# Patient Record
Sex: Male | Born: 1945
Health system: Southern US, Community
[De-identification: ages and names within clinical notes are randomized; demographics above are authoritative.]

## PROBLEM LIST (undated history)

## (undated) DIAGNOSIS — J449 Chronic obstructive pulmonary disease, unspecified: Secondary | ICD-10-CM

## (undated) DIAGNOSIS — R55 Syncope and collapse: Secondary | ICD-10-CM

## (undated) DIAGNOSIS — R0600 Dyspnea, unspecified: Secondary | ICD-10-CM

## (undated) DIAGNOSIS — I219 Acute myocardial infarction, unspecified: Secondary | ICD-10-CM

## (undated) DIAGNOSIS — I471 Supraventricular tachycardia, unspecified: Secondary | ICD-10-CM

## (undated) DIAGNOSIS — E785 Hyperlipidemia, unspecified: Secondary | ICD-10-CM

## (undated) DIAGNOSIS — I2510 Atherosclerotic heart disease of native coronary artery without angina pectoris: Secondary | ICD-10-CM

## (undated) DIAGNOSIS — Z8669 Personal history of other diseases of the nervous system and sense organs: Secondary | ICD-10-CM

## (undated) DIAGNOSIS — I10 Essential (primary) hypertension: Secondary | ICD-10-CM

## (undated) DIAGNOSIS — I480 Paroxysmal atrial fibrillation: Secondary | ICD-10-CM

## (undated) DIAGNOSIS — K219 Gastro-esophageal reflux disease without esophagitis: Secondary | ICD-10-CM

## (undated) HISTORY — DX: Syncope and collapse: R55

## (undated) HISTORY — DX: Atherosclerotic heart disease of native coronary artery without angina pectoris: I25.10

## (undated) HISTORY — DX: Essential (primary) hypertension: I10

## (undated) HISTORY — DX: Chronic obstructive pulmonary disease, unspecified: J44.9

## (undated) HISTORY — DX: Supraventricular tachycardia: I47.1

## (undated) HISTORY — DX: Hyperlipidemia, unspecified: E78.5

## (undated) HISTORY — DX: Personal history of other diseases of the nervous system and sense organs: Z86.69

## (undated) HISTORY — DX: Supraventricular tachycardia, unspecified: I47.10

## (undated) HISTORY — DX: Paroxysmal atrial fibrillation: I48.0

## (undated) HISTORY — DX: Gastro-esophageal reflux disease without esophagitis: K21.9

## (undated) HISTORY — DX: Dyspnea, unspecified: R06.00

## (undated) HISTORY — DX: Acute myocardial infarction, unspecified: I21.9

---

## 1969-11-23 HISTORY — PX: KNEE SURGERY: SHX244

## 1999-02-03 ENCOUNTER — Emergency Department (HOSPITAL_COMMUNITY): Admission: EM | Admit: 1999-02-03 | Discharge: 1999-02-03 | Payer: Self-pay | Admitting: Emergency Medicine

## 1999-02-12 ENCOUNTER — Ambulatory Visit (HOSPITAL_BASED_OUTPATIENT_CLINIC_OR_DEPARTMENT_OTHER): Admission: RE | Admit: 1999-02-12 | Discharge: 1999-02-12 | Payer: Self-pay | Admitting: Orthopedic Surgery

## 2001-03-18 ENCOUNTER — Encounter: Payer: Self-pay | Admitting: Specialist

## 2001-03-18 ENCOUNTER — Ambulatory Visit (HOSPITAL_COMMUNITY): Admission: RE | Admit: 2001-03-18 | Discharge: 2001-03-18 | Payer: Self-pay | Admitting: Specialist

## 2001-04-29 ENCOUNTER — Ambulatory Visit (HOSPITAL_COMMUNITY): Admission: RE | Admit: 2001-04-29 | Discharge: 2001-04-29 | Payer: Self-pay | Admitting: Orthopedic Surgery

## 2001-04-29 ENCOUNTER — Encounter: Payer: Self-pay | Admitting: Orthopedic Surgery

## 2007-03-31 ENCOUNTER — Emergency Department (HOSPITAL_COMMUNITY): Admission: EM | Admit: 2007-03-31 | Discharge: 2007-03-31 | Payer: Self-pay | Admitting: Family Medicine

## 2007-04-12 ENCOUNTER — Ambulatory Visit: Payer: Self-pay | Admitting: Internal Medicine

## 2007-04-13 ENCOUNTER — Ambulatory Visit (HOSPITAL_COMMUNITY): Admission: RE | Admit: 2007-04-13 | Discharge: 2007-04-13 | Payer: Self-pay | Admitting: Internal Medicine

## 2007-04-14 ENCOUNTER — Ambulatory Visit: Payer: Self-pay | Admitting: *Deleted

## 2007-05-06 ENCOUNTER — Ambulatory Visit (HOSPITAL_COMMUNITY): Admission: RE | Admit: 2007-05-06 | Discharge: 2007-05-06 | Payer: Self-pay | Admitting: Internal Medicine

## 2007-05-12 ENCOUNTER — Ambulatory Visit: Payer: Self-pay | Admitting: Internal Medicine

## 2007-06-09 ENCOUNTER — Ambulatory Visit: Payer: Self-pay | Admitting: Internal Medicine

## 2007-06-30 DIAGNOSIS — J309 Allergic rhinitis, unspecified: Secondary | ICD-10-CM | POA: Insufficient documentation

## 2007-06-30 DIAGNOSIS — I10 Essential (primary) hypertension: Secondary | ICD-10-CM | POA: Insufficient documentation

## 2007-06-30 DIAGNOSIS — Z87898 Personal history of other specified conditions: Secondary | ICD-10-CM | POA: Insufficient documentation

## 2007-06-30 DIAGNOSIS — Z8719 Personal history of other diseases of the digestive system: Secondary | ICD-10-CM | POA: Insufficient documentation

## 2007-06-30 DIAGNOSIS — R0602 Shortness of breath: Secondary | ICD-10-CM | POA: Insufficient documentation

## 2007-08-03 ENCOUNTER — Telehealth (INDEPENDENT_AMBULATORY_CARE_PROVIDER_SITE_OTHER): Payer: Self-pay | Admitting: Internal Medicine

## 2007-11-27 ENCOUNTER — Emergency Department (HOSPITAL_COMMUNITY): Admission: EM | Admit: 2007-11-27 | Discharge: 2007-11-27 | Payer: Self-pay | Admitting: *Deleted

## 2008-02-05 ENCOUNTER — Telehealth (INDEPENDENT_AMBULATORY_CARE_PROVIDER_SITE_OTHER): Payer: Self-pay | Admitting: Internal Medicine

## 2009-05-21 ENCOUNTER — Ambulatory Visit: Payer: Self-pay | Admitting: Cardiology

## 2009-05-21 ENCOUNTER — Inpatient Hospital Stay (HOSPITAL_COMMUNITY): Admission: EM | Admit: 2009-05-21 | Discharge: 2009-05-23 | Payer: Self-pay | Admitting: Emergency Medicine

## 2009-05-22 ENCOUNTER — Encounter: Payer: Self-pay | Admitting: Cardiology

## 2009-05-22 HISTORY — PX: CARDIAC ELECTROPHYSIOLOGY STUDY AND ABLATION: SHX1294

## 2009-05-23 ENCOUNTER — Encounter: Payer: Self-pay | Admitting: Cardiology

## 2009-07-25 DIAGNOSIS — I498 Other specified cardiac arrhythmias: Secondary | ICD-10-CM | POA: Insufficient documentation

## 2009-07-25 DIAGNOSIS — R55 Syncope and collapse: Secondary | ICD-10-CM | POA: Insufficient documentation

## 2009-07-25 DIAGNOSIS — I4891 Unspecified atrial fibrillation: Secondary | ICD-10-CM | POA: Insufficient documentation

## 2009-08-05 ENCOUNTER — Encounter (INDEPENDENT_AMBULATORY_CARE_PROVIDER_SITE_OTHER): Payer: Self-pay | Admitting: *Deleted

## 2009-10-07 ENCOUNTER — Emergency Department (HOSPITAL_COMMUNITY): Admission: EM | Admit: 2009-10-07 | Discharge: 2009-10-07 | Payer: Self-pay | Admitting: Emergency Medicine

## 2010-11-05 ENCOUNTER — Emergency Department (HOSPITAL_COMMUNITY)
Admission: EM | Admit: 2010-11-05 | Discharge: 2010-11-05 | Payer: Self-pay | Source: Home / Self Care | Admitting: Emergency Medicine

## 2011-03-02 LAB — BASIC METABOLIC PANEL
BUN: 15 mg/dL (ref 6–23)
CO2: 29 mEq/L (ref 19–32)
Calcium: 8.7 mg/dL (ref 8.4–10.5)
Chloride: 108 mEq/L (ref 96–112)
Creatinine, Ser: 1.24 mg/dL (ref 0.4–1.5)
Creatinine, Ser: 1.3 mg/dL (ref 0.4–1.5)
GFR calc Af Amer: >60 mL/min (ref >60)
GFR calc Af Amer: >60 mL/min (ref >60)
GFR calc non Af Amer: 56 mL/min — ABNORMAL LOW (ref >60)
GFR calc non Af Amer: 59 mL/min — ABNORMAL LOW (ref >60)
Potassium: 3.8 mEq/L (ref 3.5–5.1)

## 2011-03-02 LAB — DIFFERENTIAL
Lymphocytes Relative: 23 % (ref 12–46)
Lymphs Abs: 1.8 10*3/uL (ref 0.7–4.0)
Monocytes Relative: 8 % (ref 3–12)
Neutrophils Relative %: 66 % (ref 43–77)

## 2011-03-02 LAB — CBC
HCT: 45.9 % (ref 39.0–52.0)
MCV: 89.7 fL (ref 78.0–100.0)
Platelets: 191 10*3/uL (ref 150–400)
RBC: 5.12 MIL/uL (ref 4.22–5.81)
WBC: 8.2 10*3/uL (ref 4.0–10.5)

## 2011-03-02 LAB — POCT CARDIAC MARKERS
CKMB, poc: 1.1 ng/mL (ref 1.0–8.0)
CKMB, poc: 1.1 ng/mL (ref 1.0–8.0)
Myoglobin, poc: 180 ng/mL (ref 12–200)

## 2011-03-02 LAB — TROPONIN I: Troponin I: 0.03 ng/mL (ref 0.00–0.06)

## 2011-03-02 LAB — URINALYSIS, ROUTINE W REFLEX MICROSCOPIC
Bilirubin Urine: NEGATIVE
Hgb urine dipstick: NEGATIVE
Ketones, ur: NEGATIVE mg/dL
Nitrite: NEGATIVE
Protein, ur: NEGATIVE mg/dL
Urobilinogen, UA: 1 mg/dL (ref 0.0–1.0)

## 2011-03-02 LAB — RAPID URINE DRUG SCREEN, HOSP PERFORMED
Amphetamines: NOT DETECTED
Opiates: NOT DETECTED
Tetrahydrocannabinol: NOT DETECTED

## 2011-03-02 LAB — PROTIME-INR: Prothrombin Time: 12.8 seconds (ref 11.6–15.2)

## 2011-03-02 LAB — D-DIMER, QUANTITATIVE: D-Dimer, Quant: 0.42 ug/mL-FEU (ref 0.00–0.48)

## 2011-03-02 LAB — CARDIAC PANEL(CRET KIN+CKTOT+MB+TROPI)
Relative Index: INVALID (ref 0.0–2.5)
Total CK: 115 U/L (ref 7–232)

## 2011-03-02 LAB — LIPID PANEL
Cholesterol: 158 mg/dL (ref 0–200)
HDL: 25 mg/dL — ABNORMAL LOW (ref >39)
LDL Cholesterol: 117 mg/dL — ABNORMAL HIGH (ref 0–99)
Total CHOL/HDL Ratio: 6.3 RATIO
VLDL: 16 mg/dL (ref 0–40)

## 2011-03-02 LAB — BRAIN NATRIURETIC PEPTIDE: Pro B Natriuretic peptide (BNP): 99 pg/mL (ref 0.0–100.0)

## 2011-03-02 LAB — CK TOTAL AND CKMB (NOT AT ARMC)
CK, MB: 2.7 ng/mL (ref 0.3–4.0)
Relative Index: 2.1 (ref 0.0–2.5)

## 2011-03-02 LAB — MAGNESIUM: Magnesium: 2.2 mg/dL (ref 1.5–2.5)

## 2011-03-02 LAB — APTT: aPTT: 25 seconds (ref 24–37)
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
## 2011-04-10 NOTE — Op Note (Signed)
[facility]  Patient:    [name], [name]                     MRN: [id number] Proc. Date: 04/29/01 Adm. Date:  [id number] Attending:  Skip Mayer                           Operative Report  PREOPERATIVE DIAGNOSES: 1. Bucket handle tear of the posterior aspect of the medial meniscus, left    knee. 2. 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  POSTOPERATIVE DIAGNOSES: 1. Bucket handle tear of the posterior aspect of the medial meniscus, left    knee. 2. 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  OPERATION: 1. Diagnostic arthroscopy, left knee. 2. Synovectomy, left knee. 3. Partial medial meniscectomy, left knee.  SURGEON:  Georges Lynch. Darrelyn Hillock, M.D.  ASSISTANT:  Nurse.  DESCRIPTION OF PROCEDURE:  Under general anesthesia, routine orthopedic prep and drape of left lower extremity carried out.  Small punctate incision made in the suprapatellar pouch.  Inflow cannula was inserted.  Knee was distended with saline.  Another small punctate incision was made in the anterior lateral joint space.  I entered the arthroscope and did a complete diagnostic arthroscopy.  The only pertinent positive findings was 1) he had degenerative arthritis of the medial compartment of the left knee; and 2) he had a large bucket handle tear which was very irregular at the posterior of the medial meniscus, left knee.  In introduced the shaver suction device at the medial approach and did a synovectomy and then did a medial meniscectomy.  I thoroughly irrigated out the knee, closed all three punctate incisions with 3-0 nylon suture.  I injected 20 cc of 0.5% Marcaine with epinephrine into the knee joint.  Sterile Neosporin bundle dressing was applied.  FOLLOWUP CARE: 1. He will be on crutches, partial weightbearing. 2. Bufferin 1 twice a day as anticoagulant for a few weeks. 3. Mepergan Fortis for pain. 4. I will  see him in the office in 12 to 14 days or prior for evidence    of problem. DD:  04/29/01 TD:  04/30/01 Job: [id number]

## 2011-04-25 ENCOUNTER — Inpatient Hospital Stay (HOSPITAL_COMMUNITY)
Admission: EM | Admit: 2011-04-25 | Discharge: 2011-04-29 | DRG: 246 | Disposition: A | Discharge status: Home / Self Care | Payer: Medicare Other | Attending: Internal Medicine | Admitting: Internal Medicine

## 2011-04-25 ENCOUNTER — Emergency Department (HOSPITAL_COMMUNITY): Payer: Medicare Other

## 2011-04-25 DIAGNOSIS — I200 Unstable angina: Secondary | ICD-10-CM

## 2011-04-25 DIAGNOSIS — Z91199 Patient's noncompliance with other medical treatment and regimen due to unspecified reason: Secondary | ICD-10-CM

## 2011-04-25 DIAGNOSIS — I219 Acute myocardial infarction, unspecified: Secondary | ICD-10-CM

## 2011-04-25 DIAGNOSIS — E669 Obesity, unspecified: Secondary | ICD-10-CM | POA: Diagnosis present

## 2011-04-25 DIAGNOSIS — I2510 Atherosclerotic heart disease of native coronary artery without angina pectoris: Secondary | ICD-10-CM

## 2011-04-25 DIAGNOSIS — K219 Gastro-esophageal reflux disease without esophagitis: Secondary | ICD-10-CM | POA: Diagnosis present

## 2011-04-25 DIAGNOSIS — I129 Hypertensive chronic kidney disease with stage 1 through stage 4 chronic kidney disease, or unspecified chronic kidney disease: Secondary | ICD-10-CM | POA: Diagnosis present

## 2011-04-25 DIAGNOSIS — E785 Hyperlipidemia, unspecified: Secondary | ICD-10-CM | POA: Diagnosis present

## 2011-04-25 DIAGNOSIS — Z7982 Long term (current) use of aspirin: Secondary | ICD-10-CM

## 2011-04-25 DIAGNOSIS — I469 Cardiac arrest, cause unspecified: Secondary | ICD-10-CM | POA: Diagnosis not present

## 2011-04-25 DIAGNOSIS — I2582 Chronic total occlusion of coronary artery: Secondary | ICD-10-CM | POA: Diagnosis present

## 2011-04-25 DIAGNOSIS — I498 Other specified cardiac arrhythmias: Secondary | ICD-10-CM | POA: Diagnosis present

## 2011-04-25 DIAGNOSIS — N189 Chronic kidney disease, unspecified: Secondary | ICD-10-CM | POA: Diagnosis present

## 2011-04-25 DIAGNOSIS — I214 Non-ST elevation (NSTEMI) myocardial infarction: Principal | ICD-10-CM | POA: Diagnosis present

## 2011-04-25 DIAGNOSIS — Z9119 Patient's noncompliance with other medical treatment and regimen: Secondary | ICD-10-CM

## 2011-04-25 DIAGNOSIS — I4901 Ventricular fibrillation: Secondary | ICD-10-CM | POA: Diagnosis not present

## 2011-04-25 HISTORY — PX: PERCUTANEOUS CORONARY STENT INTERVENTION (PCI-S): SHX6016

## 2011-04-25 HISTORY — DX: Acute myocardial infarction, unspecified: I21.9

## 2011-04-25 HISTORY — PX: LEFT HEART CATH: SHX5946

## 2011-04-25 LAB — POCT I-STAT, CHEM 8
BUN: 21 mg/dL (ref 6–23)
Calcium, Ion: 1.06 mmol/L — ABNORMAL LOW (ref 1.12–1.32)
Chloride: 103 mEq/L (ref 96–112)
Creatinine, Ser: 1.7 mg/dL — ABNORMAL HIGH (ref 0.4–1.5)
Glucose, Bld: 178 mg/dL — ABNORMAL HIGH (ref 70–99)
HCT: 50 % (ref 39.0–52.0)
Hemoglobin: 17 g/dL (ref 13.0–17.0)
Potassium: 4.2 mEq/L (ref 3.5–5.1)
Sodium: 140 meq/L (ref 135–145)
TCO2: 25 mmol/L (ref 0–100)

## 2011-04-25 LAB — CBC
HCT: 45.6 % (ref 39.0–52.0)
Hemoglobin: 16.1 g/dL (ref 13.0–17.0)
MCH: 31 pg (ref 26.0–34.0)
MCHC: 35.3 g/dL (ref 30.0–36.0)

## 2011-04-25 LAB — CARDIAC PANEL(CRET KIN+CKTOT+MB+TROPI)
Total CK: 1071 U/L — ABNORMAL HIGH (ref 7–232)
Troponin I: 18.36 ng/mL (ref <0.30)

## 2011-04-25 LAB — TROPONIN I: Troponin I: <0.3 ng/mL (ref <0.30)

## 2011-04-25 LAB — CK TOTAL AND CKMB (NOT AT ARMC): Relative Index: 0.8 (ref 0.0–2.5)

## 2011-04-25 LAB — POCT ACTIVATED CLOTTING TIME: Activated Clotting Time: 140 seconds

## 2011-04-26 LAB — LIPID PANEL
HDL: 30 mg/dL — ABNORMAL LOW (ref >39)
Total CHOL/HDL Ratio: 5.7 RATIO
Triglycerides: 158 mg/dL — ABNORMAL HIGH (ref <150)
VLDL: 32 mg/dL (ref 0–40)

## 2011-04-26 LAB — BASIC METABOLIC PANEL
CO2: 27 mEq/L (ref 19–32)
Chloride: 104 mEq/L (ref 96–112)
GFR calc non Af Amer: 55 mL/min — ABNORMAL LOW (ref >60)
Glucose, Bld: 106 mg/dL — ABNORMAL HIGH (ref 70–99)
Potassium: 3.7 mEq/L (ref 3.5–5.1)
Sodium: 141 mEq/L (ref 135–145)

## 2011-04-26 LAB — CBC
HCT: 43 % (ref 39.0–52.0)
MCH: 29.8 pg (ref 26.0–34.0)
MCHC: 33.7 g/dL (ref 30.0–36.0)
MCV: 88.5 fL (ref 78.0–100.0)
Platelets: 212 10*3/uL (ref 150–400)
RDW: 12.8 % (ref 11.5–15.5)

## 2011-04-26 LAB — CARDIAC PANEL(CRET KIN+CKTOT+MB+TROPI): Total CK: 809 U/L — ABNORMAL HIGH (ref 7–232)

## 2011-04-26 LAB — TSH: TSH: 1.181 u[IU]/mL (ref 0.350–4.500)

## 2011-04-27 DIAGNOSIS — I517 Cardiomegaly: Secondary | ICD-10-CM

## 2011-04-27 LAB — BASIC METABOLIC PANEL
BUN: 20 mg/dL (ref 6–23)
Chloride: 103 mEq/L (ref 96–112)
GFR calc Af Amer: >60 mL/min (ref >60)
Potassium: 4 mEq/L (ref 3.5–5.1)

## 2011-04-27 LAB — CBC
Hemoglobin: 14.2 g/dL (ref 13.0–17.0)
MCV: 89 fL (ref 78.0–100.0)
Platelets: 213 10*3/uL (ref 150–400)
RBC: 4.83 MIL/uL (ref 4.22–5.81)
WBC: 11.6 10*3/uL — ABNORMAL HIGH (ref 4.0–10.5)

## 2011-04-27 LAB — POCT ACTIVATED CLOTTING TIME: Activated Clotting Time: 523 seconds

## 2011-04-28 DIAGNOSIS — I4901 Ventricular fibrillation: Secondary | ICD-10-CM

## 2011-04-28 LAB — POCT ACTIVATED CLOTTING TIME: Activated Clotting Time: 370 seconds

## 2011-04-28 LAB — BASIC METABOLIC PANEL
Chloride: 105 mEq/L (ref 96–112)
GFR calc Af Amer: >60 mL/min (ref >60)
Potassium: 4.1 mEq/L (ref 3.5–5.1)

## 2011-04-29 DIAGNOSIS — I214 Non-ST elevation (NSTEMI) myocardial infarction: Secondary | ICD-10-CM

## 2011-04-29 LAB — BASIC METABOLIC PANEL
CO2: 30 mEq/L (ref 19–32)
Glucose, Bld: 97 mg/dL (ref 70–99)
Potassium: 4.2 mEq/L (ref 3.5–5.1)
Sodium: 140 mEq/L (ref 135–145)

## 2011-04-29 LAB — CBC
HCT: 40.7 % (ref 39.0–52.0)
Hemoglobin: 13.6 g/dL (ref 13.0–17.0)
WBC: 10.5 10*3/uL (ref 4.0–10.5)

## 2011-05-12 ENCOUNTER — Encounter: Payer: Self-pay | Admitting: Physician Assistant

## 2011-05-13 ENCOUNTER — Encounter: Payer: Self-pay | Admitting: Physician Assistant

## 2011-05-13 ENCOUNTER — Ambulatory Visit (INDEPENDENT_AMBULATORY_CARE_PROVIDER_SITE_OTHER): Payer: Medicare Other | Admitting: Physician Assistant

## 2011-05-13 VITALS — BP 138/90 | HR 65 | Resp 16 | Ht 68.0 in | Wt 217.0 lb

## 2011-05-13 DIAGNOSIS — J449 Chronic obstructive pulmonary disease, unspecified: Secondary | ICD-10-CM | POA: Insufficient documentation

## 2011-05-13 DIAGNOSIS — I252 Old myocardial infarction: Secondary | ICD-10-CM | POA: Insufficient documentation

## 2011-05-13 DIAGNOSIS — E785 Hyperlipidemia, unspecified: Secondary | ICD-10-CM | POA: Insufficient documentation

## 2011-05-13 DIAGNOSIS — I2510 Atherosclerotic heart disease of native coronary artery without angina pectoris: Secondary | ICD-10-CM

## 2011-05-13 DIAGNOSIS — I10 Essential (primary) hypertension: Secondary | ICD-10-CM

## 2011-05-13 DIAGNOSIS — I739 Peripheral vascular disease, unspecified: Secondary | ICD-10-CM

## 2011-05-13 LAB — BASIC METABOLIC PANEL
CO2: 31 mEq/L (ref 19–32)
Calcium: 8.8 mg/dL (ref 8.4–10.5)
GFR: 53.58 mL/min — ABNORMAL LOW (ref >60.00)
Sodium: 140 mEq/L (ref 135–145)

## 2011-05-13 MED ORDER — ALBUTEROL SULFATE HFA 108 (90 BASE) MCG/ACT IN AERS: 2.0000 | INHALATION_SPRAY | Freq: Four times a day (QID) | RESPIRATORY_TRACT | Status: DC | PRN

## 2011-05-13 MED ORDER — TIOTROPIUM BROMIDE MONOHYDRATE 18 MCG IN CAPS: 18.0000 ug | ORAL_CAPSULE | Freq: Every day | RESPIRATORY_TRACT | Status: DC

## 2011-05-13 NOTE — Assessment & Plan Note (Addendum)
No angina post-PCI.  Continue aspirin, Effient and statin therapy.  He is interested in cardiac rehabilitation and I will make that referral.  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  Given the summer heat and strenuous nature of his job, I recommended he stay out of work for now.  He will be brought back in follow up in the next month.  If he is stable at that time, we can certainly consider releasing him to go back to work.  He understands the importance of taking both Effient and aspirin.

## 2011-05-13 NOTE — Assessment & Plan Note (Signed)
He has diminished pulses on exam.  He has symptoms consistent with claudication.  Arrange ABIs.

## 2011-05-13 NOTE — Progress Notes (Signed)
History of Present Illness: Primary Cardiologist:  Dr.  Shawnie Pons  MATTOX SCHORR is a 65 y.o. 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 Ladona Ridgel in June 2010 who presented 6/2 with chest pain and elevated enzymes concerning for acute coronary syndrome.  He had some ST elevation in lead V1 and it was concerning that he had a left main equivalent lesion.  He was taken emergently for cardiac catheterization and in the Cath Lab developed VT/VF arrest treated with the defibrillation and IV amiodarone.  After resuscitation, cardiac catheterization demonstrated a completely occluded RCA and a mid 90% circumflex lesion and an EF of 20-25%.  His RCA was treated emergently with a bare-metal stent.  Follow up echo 6/4 demonstrated normalized LV function with an EF of 55-60%, mild LVH, moderate LAE.  He was brought back for staged PCI of the circumflex which was treated with a Promus drug-eluting stent on 6/5.  He returns for follow up today.  Labs: K 4.2, Creat 1.39, Hgb 13.6, TC 171, TG 158, HDL 30, LDL 109, TSH 1.81, A1C 5.5, pk TnI 19.19  He denies chest discomfort.  He has chronic shortness of breath with exertion.  This is somewhat improved since his PCI.  He does have a history of wheezing.  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  He used to be on inhalers.  He does not have any inhalers now.  He denies orthopnea or PND.  He denies edema.  He denies syncope.  He is compliant with all his medications.  He denies smoking.  Past Medical History  Diagnosis Date  . COPD (chronic obstructive pulmonary disease)     ?  Marland Kitchen Paroxysmal atrial fibrillation   . 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 Ladona Ridgel 6/10  . Syncope and collapse   . History of migraines   . GERD (gastroesophageal reflux disease)   . Dyspnea   . Hypertension   .  Allergic rhinitis   . HLD (hyperlipidemia)   . CAD (coronary artery disease)     a. s/p NSTEMI 6/12: c/b VT/VF arrest req. defib and amio IV rx., pRCA occluded at cath and treated with BMS;  staged PCI of mCFX 90% on 04/28/11 with Promus DES;  residual CAD at cath 6/12: pLAD 20%, pCFX 40%, oOM1 70%, oRI 60-70%, EF 20-25%;   follow up Echo 6/12 with recovered EF:  EF 55-60%, mild LVH, mod LAE    Current Outpatient Prescriptions  Medication Sig Dispense Refill  . aspirin 81 MG tablet Take 81 mg by mouth daily.        . carvedilol (COREG) 12.5 MG tablet Take 12.5 mg by mouth 2 (two) times daily with a meal.        . lisinopril (PRINIVIL,ZESTRIL) 10 MG tablet Take 10 mg by mouth daily.        Marland Kitchen omeprazole (PRILOSEC OTC) 20 MG tablet Take 20 mg by mouth daily.        . prasugrel (EFFIENT) 10 MG TABS Take 10 mg by mouth daily.        . rosuvastatin (CRESTOR) 40 MG tablet Take 40 mg by mouth daily.        Marland Kitchen DISCONTD: fexofenadine (ALLEGRA) 180 MG tablet Take 180 mg by mouth daily.        Marland Kitchen  DISCONTD: metoprolol (TOPROL-XL) 50 MG 24 hr tablet Take 75 mg by mouth daily.        Marland Kitchen DISCONTD: tiotropium (SPIRIVA) 18 MCG inhalation capsule Place 18 mcg into inhaler and inhale daily.          Allergies: Allergies  Allergen Reactions  . Codeine     ROS:  See the history of present illness.  No cough, vomiting, diarrhea.  He does note bilateral calf pain with walking that goes away with rest.  All other systems reviewed and negative.  Vital Signs: BP 138/90  Pulse 65  Resp 16  Ht 5\' 8"  (1.727 m)  Wt 217 lb (98.431 kg)  BMI 32.99 kg/m2 Repeat blood pressure by me in the right arm 126/80  PHYSICAL EXAM: Well nourished, well developed, in no acute distress HEENT: normal Neck: no JVD Vascular: Carotids without bruits; DP and PT diminished bilaterally, no femoral artery bruits noted Cardiac:  normal S1, S2; RRR; no murmur Lungs:  Decreased breath sounds bilaterally, no wheezing, rhonchi or  rales Abd: soft, nontender, no hepatomegaly; No abdominal bruits or pulsatile masses noted Ext: no edema; RFA site without hematoma or bruit Skin: warm and dry Neuro:  CNs 2-12 intact, no focal abnormalities noted  EKG:  Sinus rhythm, heart rate 65, normal axis, no ischemic changes  ASSESSMENT AND PLAN:

## 2011-05-13 NOTE — Assessment & Plan Note (Signed)
I will place him on Spiriva once daily and Proventil HFA p.r.n.  He will be referred to pulmonology for further management.

## 2011-05-13 NOTE — Assessment & Plan Note (Signed)
Check lipids and LFTs in 6-8 weeks.

## 2011-05-13 NOTE — Assessment & Plan Note (Addendum)
Controlled.  He has had some lightheaded feelings and a blood pressure in the 90s recorded on at least one occasion at home.  If he continues to experience this, we can certainly change his lisinopril to 5 mg twice a day or just decrease it to 5 mg once a day.  However, for now, continue current therapy.  Check a basic metabolic panel today.

## 2011-05-13 NOTE — Patient Instructions (Addendum)
Your physician recommends that you schedule a follow-up appointment in: 06/19/11 @ 11:30 with Dr. Riley Kill as per Tereso Newcomer, PA-C.  You have been referred to CARDIAC REHAB 401.9, 414.00  You have been referred to PULMONOLOGY FOR COPD  Your physician has requested that you have an ankle brachial index (ABI) 443.9 CLAUDICATION. During this test an ultrasound and blood pressure cuff are used to evaluate the arteries that supply the arms and legs with blood. Allow thirty minutes for this exam. There are no restrictions or special instructions.  Your physician has recommended you make the following change in your medication: START SPIRIVA [facility] INHALER  Your physician recommends that you return for lab work in: 07/20/11 FASTING LIVER/LIPID PANEL 272.4  Your physician recommends that you return for lab work in: TODAY BMET 401.9, 414.00

## 2011-05-28 NOTE — H&P (Signed)
[name], [name]              ACCOUNT NO.:  [id number]  MEDICAL RECORD NO.:  [id number]           PATIENT TYPE:  E  LOCATION:  [facility]                         FACILITY:  [facility]  PHYSICIAN:  Bevelyn Buckles. Rayyan Orsborn, MDDATE OF BIRTH:  1946/08/02  DATE OF ADMISSION:  04/25/2011 DATE OF DISCHARGE:                             HISTORY & PHYSICAL   PRIMARY CARE PHYSICIAN:  [facility] [place], I am not sure exactly which one.  REASON FOR ADMISSION:  Unstable angina.  HISTORY OF PRESENT ILLNESS:  [name] is a 65 year old male with a history of obesity, hypertension, gastroesophageal reflux disease, and SVT.  He denies any known history of coronary artery disease.  He was admitted in June 2010 with syncope and found to have an SVT.  He underwent ablation of an AV reentrant tachycardia by Dr. Ladona Ridgel, this was successful.  He says that over the past several months, he has been noticing increasing dyspnea on exertion, but no chest pain today.  Today, he was out mowing the grass, just he was about to finish and developed severe chest pain, which lasted about an hour, was not getting any better.  He called EMS and he was brought to emergency room and he was given nitroglycerin and aspirin.  Initial EKG showed sinus rhythm with 1-2 mm ST elevation in AVR and slight ST elevation in V1.  There is diffuse ST depression throughout the anterolateral leads.  His chest pain was improved, but he developed nausea and continued to feel queasy.  He denies any heart failure.  He has not had any bleeding.  He has been noncompliant with blood pressure medicines.  The remainder review of systems, all systems negative except for HPI and problem list.  PROBLEM LIST: 1. History of atrioventricular reentrant tachycardia with associated     syncope, status post ablation in 2010. 2. Hypertension. 3. Obesity. 4. Gastroesophageal reflux disease.  CURRENT MEDICATIONS:  Prevacid 30 mg a day and  aspirin 81.  He is not taking his blood pressure medicine.  ALLERGIES:  CODEINE, which causes nausea.  SOCIAL HISTORY:  He lives with his sister.  He is widowed.  His son is here with him today and history of tobacco use, but quit about 12 years ago.  Denies any significant alcohol use or drug use.  FAMILY HISTORY:  Mother died in her 53s from old age.  Father died from black lung.  PHYSICAL EXAMINATION:  GENERAL:  He is somewhat ill appearing. VITAL SIGNS:  Respirations are unlabored.  Blood pressure is 115/60, heart rates 100, and satting 95% at 2 L oxygen by nasal cannula. HEENT:  Notable for poor dentition, otherwise normal. NECK:  Supple.  No obvious JVD, though it is hard to assess as his neck is thick.  Carotids are 1+ bilaterally without any obvious bruits.  No lymphadenopathy or thyromegaly. CARDIAC:  PMI is not palpable.  He is regular with distant heart sounds. No obvious murmurs. LUNGS:  Clear. ABDOMEN:  Obese, nontender, nondistended.  No hepatosplenomegaly.  No bruits or masses. EXTREMITIES:  Warm with no cyanosis, clubbing, or edema.  No rash. NEUROLOGIC:  Alert and oriented x3.  Cranial nerves II through XII are intact.  Moves all 4 extremities without difficulty.  LABORATORY DATA:  Labs are pending as is his chest x-ray.  EKG shows sinus rhythm at a rate of 99.  There is anterolateral ST depression with 1-2 mm ST elevation in AVR at V1.  ASSESSMENT: 1. Chest pain concerning for unstable angina/acute coronary syndrome. 2. Hypertension. 3. Atrioventricular reentrant tachycardia status post radiofrequency     ablation in 2009.  PLAN/DISCUSSION:  Although, [name] chest pain is improving, his EKG is quite concerning to me for a left main equivalent.  We will treat him with aspirin, heparin, nitroglycerin, and beta-blocker.  We will take him for an urgent cardiac catheterization.  I have discussed this approach with him and his son as well as Dr. Riley Kill  and all are in agreement to proceed.     Bevelyn Buckles. Jensyn Shave, MD     DRB/MEDQ  D:  04/25/2011  T:  04/25/2011  Job:  [id number]  Electronically Signed by Arvilla Meres MD on 05/28/2011 03:22:11 PM

## 2011-05-28 NOTE — Cardiovascular Report (Signed)
  NAMEJONERIK, [name]              ACCOUNT NO.:  [id number]  MEDICAL RECORD NO.:  [id number]           PATIENT TYPE:  I  LOCATION:  2903                         FACILITY:  [facility]  PHYSICIAN:  Bevelyn Buckles. Jenson Beedle, MDDATE OF BIRTH:  February 21, 1946  DATE OF PROCEDURE:  04/25/2011 DATE OF DISCHARGE:                           CARDIAC CATHETERIZATION   PATIENT IDENTIFICATION:  [name] is a 65 year old male with a history of hypertension, gastroesophageal reflux disease, and SVT status post previous ablation.  He denies any history of known cardiac disease. He was admitted through the ER with severe chest pain.  EKG was abnormal but did not meet formal criteria for STEMI.  Given his EKG changes, however, we decided to bring him for urgent cardiac catheterization.  PROCEDURES PERFORMED: 1. Selective coronary angiography. 2. Left heart cath. 3. Left ventriculogram.  The risks and indications were explained.  Emergency consent was applied.  The right groin area was prepped and draped in a routine sterile fashion and anesthetized with 1% local lidocaine.  As the patient was getting ready for catheterization, he developed ventricular tachycardia/fibrillation.  He was defibrillated x2 and started on IV amiodarone and was also treated with magnesium.  Access was then obtained and a 6-French arterial sheath was placed using a modified Seldinger technique.  Standard catheters including JL-4, JR-4, and angled pigtail were used.  There were, otherwise, no apparent complications.  Central aortic pressure 159/96, mean of 123.  LV pressure 137/60 with an EDP of 24.  There was no aortic stenosis.  Left main had some mild plaquing distally.  No high-grade stenosis.  LAD is a long vessel wrapping the apex and gave off a large diagonal.  There was mild plaquing in the proximal LAD of approximately 20%.  Left circumflex was a dominant vessel and gave off a large ramus and a small OM-1, large  posterolateral, and a small PDA.  There was a 40% lesion in the proximal AV groove circ and 90% lesion in the mid circ. In the ostium of the small OM-1, there was a 70% lesion.  In the ostium of the ramus branch, there was a 60-70% lesion.  The RCA was totally occluded proximally.  Left ventriculogram done in the RAO position showed an EF of 20-25% with global hypokinesis.  ASSESSMENT: 1. Coronary artery disease with totally occluded right coronary artery     and a high-grade mid left circumflex lesion. 2. Severe left ventricular dysfunction.  Left ventricular ejection     fraction of 20-25%. 3. Ventricular tachycardia/ventricular fibrillation x2, status post     defibrillation.  PLAN/DISCUSSION:  He will undergo percutaneous intervention on the RCA by Dr. Riley Kill, he will likely need a staged PCI of his left circumflex. We will treat him with ACE inhibitor and beta-blocker for his decreased ejection fraction.     Bevelyn Buckles. Brett Darko, MD     DRB/MEDQ  D:  04/25/2011  T:  04/26/2011  Job:  [id number]  Electronically Signed by Arvilla Meres MD on 05/28/2011 03:22:15 PM

## 2011-06-01 ENCOUNTER — Other Ambulatory Visit (HOSPITAL_COMMUNITY): Payer: Self-pay | Admitting: *Deleted

## 2011-06-02 ENCOUNTER — Encounter (INDEPENDENT_AMBULATORY_CARE_PROVIDER_SITE_OTHER): Payer: Medicare Other | Admitting: *Deleted

## 2011-06-02 ENCOUNTER — Institutional Professional Consult (permissible substitution): Payer: Medicare Other | Admitting: Pulmonary Disease

## 2011-06-02 DIAGNOSIS — I739 Peripheral vascular disease, unspecified: Secondary | ICD-10-CM

## 2011-06-04 ENCOUNTER — Encounter: Payer: Self-pay | Admitting: Physician Assistant

## 2011-06-11 NOTE — Cardiovascular Report (Signed)
[name], [name]              ACCOUNT NO.:  [id number]  MEDICAL RECORD NO.:  [id number]  LOCATION:  6527                         FACILITY:  [facility]  PHYSICIAN:  Arturo Morton. Riley Kill, MD, FACCDATE OF BIRTH:  [birth year]  DATE OF PROCEDURE:  04/28/2011 DATE OF DISCHARGE:                           CARDIAC CATHETERIZATION   INDICATIONS:  Mr. Spray is a 65 year old gentleman with a history of hypertension, GE reflux and has previously undergone SVT ablation here at [facility].  He presented with chest pain with borderline abnormal EKG.  His pain was relieved, but Dr. Gala Romney wisely felt that the patient should be studied.  He had diffuse ST-segment change.  It was noted in multiple leads.  He was brought to the catheterization laboratory where he was prepped and draped and Dr. Gala Romney was getting ready to perform his procedure.  The patient developed ventricular fibrillation and cardiac arrest.  The leads involved included I, aVL, lead II, lead V4-V6.  The right coronary artery was totally occluded. The circumflex had a high-grade distal stenosis as well as an intermediate stenosis.  The distal stenosis looked a little bit more worrisome, but we still thought that the culprit was the right coronary artery.  The RCA then was opened, and successfully stented even though the vascular distribution of the RCA territory was relatively small.  He was treated with dual-antiplatelet therapy and has had an uncomplicated course.  Importantly, at the time the patient had his first ventriculogram done, he had significant reduction in left ventricular systolic function with ejection fraction in the 25-30% range.  We have repeated his echocardiogram and the echocardiogram suggests near recovery.  We debated about the second lesion, but given the diffuse EKG changes with the initial presentation and a somewhat worrisome appearance of the circumflex, it was felt that the circumflex also  could have represented an acute lesion.  As such, he was brought back for relook of the RCA, and probable treatment of the distal circumflex.  PROCEDURES: 1. Percutaneous stenting of the circumflex coronary artery distally. 2. Relook  angiography of the right coronary artery.  DESCRIPTION OF PROCEDURE:  The patient was brought to the cath lab and an Allen's test was performed on both hands with the intention of using the radial approach.  Both studies were abnormal.  We then elected to attempt to use the left groin, and we were able to get access using the left femoral artery but there was marked tortuosity in the distal iliac, and the left femoral artery could not be accessed successfully due to inability to navigate the wire likely due to tortuosity.  We then reentered the right femoral artery and a 6-French sheath was placed. Bivalirudin was given according to protocol.  The [employer] Unit in Cath Lab 2 then went down, and the patient had to be transferred to Cath Lab 6 for the completion of the procedure.  Views were obtained.  The lesion was crossed after adequate anticoagulation.  Predilatation was done with a 2.2-mm balloon.  Following stenting, the 80% plus stenosis reduced to 0% residual luminal narrowing.  There did not appear to be significant edge compromise and final angiographic result was excellent.  There was  TIMI 3 flow at the completion of the procedure.  All catheters were subsequently removed and the femoral sheath sewn into place.  He was taken to the holding area in satisfactory clinical condition.  ANGIOGRAPHIC DATA: 1. The right coronary artery was previously occluded, now is widely     patent.  The stent is widely patent with good distal runoff TIMI 3     flow.  Distribution of the distal right coronary artery is     relatively small, and seemingly not quite large enough to account     for the original electrocardiographic findings. 2. The circumflex coronary  artery has an 80% irregular margin lesion     leading into a moderate size posterolateral territory.  Following     stenting, this was reduced to 0% residual luminal narrowing without     significant compromise.  CONCLUSION: 1. Successful percutaneous stenting of the circumflex coronary artery. 2. Continued patency of the right coronary artery.  DISPOSITION:  The patient will be treated medically.  I have stressed to the patient, the sister, and the son the necessity of continuing to take his medications appropriately.  We will try to get him assistance.     Arturo Morton. Riley Kill, MD, [facility]     TDS/MEDQ  D:  04/28/2011  T:  04/29/2011  Job:  [id number]  cc:   Bevelyn Buckles. Bensimhon, MD Arturo Morton. Riley Kill, MD, [facility] CV Laboratory  Electronically Signed by Shawnie Pons MD [facility] on 06/11/2011 07:20:50 AM

## 2011-06-11 NOTE — Discharge Summary (Signed)
[name], [name]              ACCOUNT NO.:  [id number]  MEDICAL RECORD NO.:  [id number]  LOCATION:  6527                         FACILITY:  [facility]  PHYSICIAN:  Arturo Morton. Riley Kill, MD, FACCDATE OF BIRTH:  10/26/1946  DATE OF ADMISSION:  04/25/2011 DATE OF DISCHARGE:  04/29/2011                              DISCHARGE SUMMARY   PRIMARY CARDIOLOGIST:  Arturo Morton. Riley Kill, MD, [facility]  PRIMARY CARE PROVIDER:  [facility] [place].  DISCHARGE DIAGNOSES: 1. Myocardial infarction with defibrillation arrest. 2. Non-ST-elevation myocardial infarction, status post percutaneous     stenting of the proximal right coronary artery due to total     occlusion on April 25, 2011.  Status post percutaneous stenting of     the circumflex coronary artery and continued patency of the right     coronary on April 28, 2011. 3. Hypertension. 4. 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 5. Hyperlipidemia.  SECONDARY DIAGNOSES: 1. Obesity. 2. Gastroesophageal reflux disease. 3. Supraventricular tachycardia, status post ablation.  ALLERGIES:  CODEINE.  PROCEDURES/DIAGNOSTICS PERFORMED DURING HOSPITALIZATION: 1. Left heart catheterization with selective coronary angiography and     left ventriculogram.  On April 25, 2011.     a.     Totally occluded right coronary artery and mid left      circumflex lesion.  Severe left ventricular dysfunction, estimated      ejection fraction 20-25%. 2. Left ventricular tachycardia/ventricular fibrillation. 3. Status post successful nondrug-eluting stent to the proximal right     coronary artery with a Multilink Vision 2.75 x 23 mm stent. 4. Relook angiography of the right coronary artery on April 28, 2011.     a.     Status post PROMUS element drug-eluting stent, 2.25 x 20 mm      to the circumflex coronary artery. 5. Echo on April 27, 2011:  Left ventricle with normal systolic     function, estimated ejection fraction 55-60%.  Diastolic     dysfunction  noted.  Left atrium moderately dilated.  Chest x-ray on April 25, 2011:  No active lung disease.  REASON FOR HOSPITALIZATION:  This is a 65 year old gentleman without prior known coronary artery disease who has noticed increased dyspnea on exertion for the last several months.  On day of admission, the patient was mowing his grass and developed acute severe chest pain that lasted several hours.  When the pain did not ease, he was brought to the emergency department at [facility] via EMS.  Initial EKG showed sinus rhythm with 1-2 mm ST-elevation in IVR and slight ST-elevation in V1 with diffuse depression throughout the anterolateral leads.  The patient was given nitroglycerin and aspirin.  The patient's chest pain had improved upon arrival.  Dr. Gala Romney evaluated the patient in the emergency department, although the patient's chest pain had improved.  His EKG was concerning for left main equivalent, although he did not need formal criteria for STEMI.  The patient was brought urgently to the cardiac cath lab by Dr. Gala Romney.  Informed consent was obtained.  HOSPITAL COURSE:  The patient was brought urgently to the cardiac cath lab.  Although, the patient was getting rate per catheterization, he developed ventricular tachycardia/fibrillation.  The patient underwent defibrillation x2 and started on IV amiodarone as well as treated with magnesium.  The patient returned to sinus rhythm.  Catheterization demonstrated a large circumflex system with 89% focal lesion.  No critical disease in the LAD.  The RCA was totally occluded.  Dr. Riley Kill then performed successful percutaneous stenting of the proximal right coronary artery to the total occlusion.  It was noted that the patient's ejection fraction was down 20-30%.  I felt the patient would be monitored throughout the evening with repeat echocardiogram and plans for PCI of the circumflex when stable.  The patient was brought to the CCU for  observation and the patient had no further complaints of chest pain.  A redo echocardiogram was obtained.  This showed a normal ejection fraction, this has recovered.  Dr. Riley Kill felt that it was appropriate for relook catheterization of the RCA and PCI of the left circumflex that was high-grade.  The patient was brought back to the cath lab on April 28, 2011, informed consent was obtained under Dr. Riley Kill.  Dr. Riley Kill had been completed successful percutaneous coronary intervention of the left circumflex with a drug-eluting stent and decrease in the stenosis from 82-0%.  The patient had no complications.  He was brought for observation.  The patient's groin sites were without signs of hematoma.  His EKG had normalized.  His ejection fraction again had normalized.  The patient ambulated with cardiac rehab without difficulty.  The patient does agree to outpatient rehab and this is sent to the [place].  The patient has had a history of noncompliance with his medications secondary to financial issues.  Medication compliance has been stressed.  The patient will be discharged on aspirin, Effient, and beta-blocker and statins.  Aide has been sent to help in the cost of the patient's Effient.  On day of discharge, Dr. Riley Kill evaluated the patient, noted him stable for home.  He showed multiple PVCs on telemetry; therefore, the patient's Coreg was increased.  DISCHARGE PLANS AND INSTRUCTIONS:  Were discussed with the patient and he voiced understanding.  The patient is not to drive until followup with Dr. Riley Kill secondary to defibrillation arrest.  DISCHARGE LABORATORY DATA:  Sodium 140, potassium 4.2, BUN 17, creatinine 1.39, WBC 2.5, hemoglobin 13.6, hematocrit 40.7 and platelets 201.  DISCHARGE MEDICATIONS: 1. Aspirin 81 mg 1 tablet daily. 2. Carvedilol 12.5 mg 1 tablet twice daily. 3. Lisinopril 10 mg daily. 4. Effient 10 mg daily. 5. Crestor 40 mg daily. 6. Prilosec 20 mg  daily.  FOLLOWUP PLANS AND INSTRUCTIONS: 1. The patient will follow up with Tereso Newcomer, physician assistant     and Dr. Riley Kill on May 13, 2011 at 10:30. 2. The patient is to increase activity slowly.  He may shower, but no     bathing.  No lifting for 1 week greater than 5 pounds.  No driving     until followup appointment.  No sexual activity for 1 week.  He is     to keep his cath site clean and dry to call office for any     problems. 3. The patient is to continue low-sodium heart-healthy diet. 4. The patient is to avoid straining and stop any active causes of     chest pain or shortness of breath. 5. The patient is to call the office in the interim for any problems     or concerns.  DURATION OF DISCHARGE:  Greater than 30 minutes with physician and physician extender time.  Leonette Monarch, PA-C   ______________________________ Arturo Morton Riley Kill, MD, [facility]    NB/MEDQ  D:  04/29/2011  T:  04/30/2011  Job:  [id number]  cc:   Arturo Morton. Riley Kill, MD, [facility]  Electronically Signed by Alen Blew P.A. on 05/17/2011 04:55:43 PM Electronically Signed by Shawnie Pons MD [employer] on 06/11/2011 07:20:53 AM

## 2011-06-11 NOTE — Cardiovascular Report (Signed)
[name], [name]              ACCOUNT NO.:  [id number]  MEDICAL RECORD NO.:  [id number]           PATIENT TYPE:  I  LOCATION:  2903                         FACILITY:  [facility]  PHYSICIAN:  Arturo Morton. Riley Kill, MD, FACCDATE OF BIRTH:  01-16-1946  DATE OF PROCEDURE:  04/25/2011 DATE OF DISCHARGE:                           CARDIAC CATHETERIZATION   INDICATIONS:  Mr. Bulson is a 65 year old gentleman who presents after mowing the grass with some chest pain.  His pain resolved before or after arriving in the emergency room.  EKG was abnormal.  There was diffuse ST-segment depression.  There was some mild ST elevation, but in one lead only.  There was contiguous in the interpretation of the electrocardiogram was marked ST abnormality with possible lateral subendocardial injury.  He was seen promptly by Dr. Gala Romney, and medical therapy was initiated.  After careful consideration, Dr. Gala Romney elected to recommend urgent cardiac catheterization to define his anatomy.  He was brought to the catheterization laboratory for further evaluation.  He underwent diagnostic catheterization by Dr. Gala Romney after an episode of ventricular tachycardia requiring cardioversion.  Catheterization demonstrated a large circumflex system with about an 80-90% focal lesion, with the circumflex system providing most of the inferior wall.  There was calcified proximal LAD disease, but no critical narrowing and modest ramus intermedius stenosis.  The RCA was totally occluded.  However, there was still no evidence of ST elevation noted on the monitor.  The patient was given amiodarone. Preparations were made for urgent percutaneous intervention.  PROCEDURE:  Percutaneous stenting of the right coronary artery.  DESCRIPTION OF PROCEDURE:  The procedure was performed from the right femoral artery with an indwelling 6-French sheath.  A JR-4 guiding catheter with side holes was utilized.  The patient was given  a weight adjusted bivalirudin after having received heparin and aspirin.  The right coronary artery was entered with a JR-4 guiding catheter with side holes because of his elevated creatinine, every attempt was made to limit contrast use.  A wire was placed down the vessel with prompt reperfusion and the balloon was passed.  Initially, a 2-mm balloon was used to open the vessel, then a 2-5 generous doses of intracoronary nitroglycerin were administered.  He was stented using a 23 length 2.75 Multilink non drug-eluting platform.  Postdilatation was done with a 3- mm noncompliant balloon.  At the completion of the procedure, there did not appear to be edge disruption, and the stent appeared to be widely patent and appropriately expanded.  Importantly, the distal right coronary artery consisted only of a single small PDA.  In addition, the patient has a history of medical noncompliance and no diabetes by history as such we elected to use a non drug-eluting stent.  The final angiographic result was excellent.  We did do a femoral angiogram, but elected not to place a sheath and large part because the patient had mild calcified stenosis at the insertion site.  He was taken to the CCU in satisfactory clinical condition.  HEMODYNAMIC DATA:  This was provided the report by Dr. Gala Romney. Central aortic pressure was 159/96, mean 123.  LV pressure 137/16.  No gradient and pullback across the aortic valve.  ANGIOGRAPHIC DATA:  The right coronary artery is totally occluded.  This is at the proximal mid junction.  Following reperfusion, there is a stenosis of about 10-12 mm in length.  Following reperfusion and stenting, this was reduced to 0% with re-establishment of TIMI 3 flow. The distal vessel has mild luminal irregularities in the mid vessel beyond the stent, and in the distal vessel, but no high-grade stenoses. The PDA is relatively small and the posterolateral system consists only mainly of  a small AV vessel.  CONCLUSION:  Successful percutaneous stenting of the proximal right coronary artery due to total occlusion.  DISPOSITION:  The patient did not have diagnostic EKG changes.  He was pain free in the emergency room.  He was brought to the laboratory pain free.  Urgent catheterization was recommended by the diffuse ST depression.  Findings included total occlusion of the small right coronary artery that supplies minimal myocardium.  His EF is down and he has circumflex residual disease as well as ramus disease.  We will repeat his echo on Monday with plan for treatment of his AV circumflex which is quite large on two state.     Arturo Morton. Riley Kill, MD, [facility]     TDS/MEDQ  D:  04/25/2011  T:  04/26/2011  Job:  [id number]  cc:   Luis Abed, MD, [facility] Bevelyn Buckles. Bensimhon, MD  Electronically Signed by Shawnie Pons MD [facility] on 06/11/2011 07:20:47 AM

## 2011-06-15 ENCOUNTER — Encounter: Payer: Self-pay | Admitting: Cardiology

## 2011-06-19 ENCOUNTER — Ambulatory Visit (INDEPENDENT_AMBULATORY_CARE_PROVIDER_SITE_OTHER): Payer: Medicare Other | Admitting: Cardiology

## 2011-06-19 ENCOUNTER — Encounter: Payer: Self-pay | Admitting: Cardiology

## 2011-06-19 DIAGNOSIS — E785 Hyperlipidemia, unspecified: Secondary | ICD-10-CM

## 2011-06-19 DIAGNOSIS — I10 Essential (primary) hypertension: Secondary | ICD-10-CM

## 2011-06-19 DIAGNOSIS — I2510 Atherosclerotic heart disease of native coronary artery without angina pectoris: Secondary | ICD-10-CM

## 2011-06-19 NOTE — Assessment & Plan Note (Signed)
Controlled at present.  Continue meds.  May eventually switch to metoprolol with history of COPD.  Await pulmonary visit.

## 2011-06-19 NOTE — Patient Instructions (Signed)
Your physician recommends that you schedule a follow-up appointment in: 2 months with Dr. Stuckey   

## 2011-06-19 NOTE — Assessment & Plan Note (Signed)
Repeat labs ordered for next month.

## 2011-06-19 NOTE — Assessment & Plan Note (Signed)
See above.  Patient had two stents, one DES the other non DES.  He is stable currently.  Continue medical therapy.

## 2011-06-19 NOTE — Progress Notes (Signed)
HPI:  Patient is in for follow up.  He is scheduled to see pulmonary in the next couple of weeks.  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  He is better since his MI.  Had non DES to RCA, and DES to the CFX.  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  Denies any chest pain. Doppler data reviewed with patient in detail.    Current Outpatient Prescriptions  Medication Sig Dispense Refill  . albuterol (PROVENTIL HFA) 108 (90 BASE) MCG/ACT inhaler Inhale 2 puffs into the lungs every 6 (six) hours as needed for wheezing.  1 Inhaler  3  . aspirin 81 MG tablet Take 81 mg by mouth daily.        . carvedilol (COREG) 12.5 MG tablet Take 12.5 mg by mouth 2 (two) times daily with a meal.        . Garlic 1000 MG CAPS Take 1 capsule by mouth daily.        Marland Kitchen lisinopril (PRINIVIL,ZESTRIL) 10 MG tablet Take 10 mg by mouth daily.        . Omega-3 Fatty Acids (FISH OIL) 1000 MG CAPS Take 2 capsules by mouth daily.        . prasugrel (EFFIENT) 10 MG TABS Take 10 mg by mouth daily.        . rosuvastatin (CRESTOR) 40 MG tablet Take 40 mg by mouth daily.        Marland Kitchen tiotropium (SPIRIVA HANDIHALER) 18 MCG inhalation capsule Place 1 capsule (18 mcg total) into inhaler and inhale daily.  1 capsule  3  . omeprazole (PRILOSEC OTC) 20 MG tablet Take 20 mg by mouth daily.          Allergies  Allergen Reactions  . Codeine     Past Medical History  Diagnosis Date  . COPD (chronic obstructive pulmonary disease)     ?  Marland Kitchen Paroxysmal atrial fibrillation   . 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 Ladona Ridgel 6/10  . Syncope and collapse   . History of migraines   . GERD (gastroesophageal reflux disease)   . Dyspnea   . Hypertension   . Allergic rhinitis   . HLD (hyperlipidemia)   . CAD (coronary artery disease)     a. s/p NSTEMI 6/12: c/b VT/VF arrest req. defib and amio IV rx., pRCA occluded at cath and treated with BMS;  staged PCI of mCFX  90% on 04/28/11 with Promus DES;  residual CAD at cath 6/12: pLAD 20%, pCFX 40%, oOM1 70%, oRI 60-70%, EF 20-25%;   follow up Echo 6/12 with recovered EF:  EF 55-60%, mild LVH, mod LAE    Past Surgical History  Procedure Date  . Knee surgery 1971    Secondary to injury  . 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 Lewayne Bunting    Family History  Problem Relation Age of Onset  . Hypertension Mother   . Coronary artery disease Mother   . Diabetes Mother     History   Social History  . Marital Status: Widowed    Spouse Name: N/A    Number of Children: 2  . Years of Education: N/A   Occupational History  . Heating and air conditioning     Part time   Social History Main Topics  . Smoking status: Former Smoker  Quit date: 11/23/2000  . Smokeless tobacco: Not on file  . Alcohol Use: Yes     Occasional beer drinker and had moonshine 6 months ago.  . Drug Use: No  . Sexually Active:    Other Topics Concern  . Not on file   Social History Narrative   His wife died last 08-19-23- widowed.Lives in [place] with his son.60-pack-year smoker but quit 10 years ago negative.    ROS: Please see the HPI.  All other systems reviewed and negative.  PHYSICAL EXAM:  BP 138/78  Pulse 64  Resp 18  Ht 5\' 8"  (1.727 m)  Wt 219 lb (99.338 kg)  BMI 33.30 kg/m2  General: Well developed, well nourished, in no acute distress. Head:  Normocephalic and atraumatic. Neck: no JVD Lungs: Clear to auscultation and percussion.  Mildly prolonged expiration.  Minimal crackles bilaterally.  Heart: Normal S1 and S2.  No murmur, rubs or gallops.  Abdomen:  Normal bowel sounds; soft; non tender; no organomegaly Pulses: Pulses normal in all 4 extremities. Extremities: No clubbing or cyanosis. No edema. Neurologic: Alert and oriented x 3.  EKG:    ASSESSMENT AND PLAN:

## 2011-06-29 ENCOUNTER — Telehealth: Payer: Self-pay | Admitting: Cardiology

## 2011-06-29 MED ORDER — PRASUGREL HCL 10 MG PO TABS: 10.0000 mg | ORAL_TABLET | Freq: Every day | ORAL | Status: DC

## 2011-06-29 NOTE — Telephone Encounter (Signed)
Effient refilled. 

## 2011-06-30 ENCOUNTER — Ambulatory Visit (INDEPENDENT_AMBULATORY_CARE_PROVIDER_SITE_OTHER)
Admission: RE | Admit: 2011-06-30 | Discharge: 2011-06-30 | Disposition: A | Discharge status: Home / Self Care | Payer: Medicare Other | Source: Ambulatory Visit | Attending: Pulmonary Disease | Admitting: Pulmonary Disease

## 2011-06-30 ENCOUNTER — Encounter: Payer: Self-pay | Admitting: Pulmonary Disease

## 2011-06-30 ENCOUNTER — Ambulatory Visit (INDEPENDENT_AMBULATORY_CARE_PROVIDER_SITE_OTHER): Payer: Medicare Other | Admitting: Pulmonary Disease

## 2011-06-30 VITALS — BP 120/78 | HR 73 | Temp 97.9°F | Ht 68.0 in | Wt 224.0 lb

## 2011-06-30 DIAGNOSIS — R0602 Shortness of breath: Secondary | ICD-10-CM

## 2011-06-30 MED ORDER — ALBUTEROL SULFATE HFA 108 (90 BASE) MCG/ACT IN AERS: 2.0000 | INHALATION_SPRAY | Freq: Four times a day (QID) | RESPIRATORY_TRACT | Status: DC | PRN

## 2011-06-30 NOTE — Progress Notes (Signed)
  Subjective:    Patient ID: [name], male    DOB: [birth year], 65 y.o.   MRN: [id number]  HPI The patient is a 65 year old male who had been asked to see for dyspnea on exertion.  He has a history of shortness of breath over the last one year, and he feels that it is getting worse.  He currently describes an approximate 4 block dyspnea on exertion at a moderate pace on flat ground, however feels that leg discomfort and weakness is a big limiting factor for this.  He will get winded bringing groceries in from the car.  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  He also worked in [employer] for 18 years, but has never been told that he has a pneumoconiosis.  Currently, he denies any cough, mucus, chest congestion.  He denies any worsening lower extremity edema.  His family member does note that he hears wheezing at times, but his description is classic for upper airway pseudo-wheezing.  The patient's weight has been neutral over the last one year.  Patient also has a history of coronary disease, and has had a recent stenting procedure in June of this year.  He has also had an echo which showed normal LV function, but did have LVH with a moderately dilated left atrium.  RV function at that time was normal   Review of Systems  Constitutional: Negative.  Negative for fever and unexpected weight change.  HENT: Positive for sneezing. Negative for ear pain, nosebleeds, congestion, sore throat, rhinorrhea, trouble swallowing, dental problem, postnasal drip and sinus pressure.   Eyes: Negative.  Negative for redness and itching.  Respiratory: Positive for shortness of breath. Negative for cough, chest tightness and wheezing.   Cardiovascular: Positive for palpitations. Negative for leg swelling.  Gastrointestinal: Negative.  Negative for nausea and vomiting.  Genitourinary: Negative.  Negative for dysuria.  Musculoskeletal: Positive for joint swelling.    Skin: Negative.  Negative for rash.  Neurological: Negative.  Negative for headaches.  Hematological: Negative.  Does not bruise/bleed easily.  Psychiatric/Behavioral: Negative.  Negative for dysphoric mood. The patient is not nervous/anxious.        Objective:   Physical Exam Constitutional:  Obese male, no acute distress  HENT:  Nares patent without discharge  Oropharynx without exudate, palate and uvula are normal  Eyes:  Perrla, eomi, no scleral icterus  Neck:  No JVD, no TMG  Cardiovascular:  Normal rate, regular rhythm, no rubs or gallops.  2/6 sem        Intact distal pulses but diminished  Pulmonary : adequate airflow, no stridor or respiratory distress   No wheezing noted, mild basilar crackles.   Abdominal:  Soft, nondistended, bowel sounds present.  No tenderness noted.   Musculoskeletal:  No lower extremity edema noted.  Lymph Nodes:  No cervical lymphadenopathy noted  Skin:  No cyanosis noted  Neurologic:  Alert, appropriate, moves all 4 extremities without obvious deficit.         Assessment & Plan:

## 2011-06-30 NOTE — Assessment & Plan Note (Signed)
The pt has worsening doe over the last year, and it is unclear how much of this may be due to lung issues, heart disease, and weight and conditioning/leg issues.  He does have a long h/o smoking and working in [employer], therefore is at risk for obstructive lung disease.  Will check cxr today, and also arrange for full pfts.  Will see him back for discussion.

## 2011-06-30 NOTE — Patient Instructions (Signed)
Will schedule for breathing studies, and see you back same day to review Will check cxr today, and will call with results Work on weight loss, increase exercise as able Continue spiriva as well as rescue inhaler for now.

## 2011-07-05 ENCOUNTER — Encounter: Payer: Self-pay | Admitting: Pulmonary Disease

## 2011-07-13 ENCOUNTER — Ambulatory Visit (INDEPENDENT_AMBULATORY_CARE_PROVIDER_SITE_OTHER): Payer: Medicare Other | Admitting: Pulmonary Disease

## 2011-07-13 ENCOUNTER — Encounter: Payer: Self-pay | Admitting: Pulmonary Disease

## 2011-07-13 DIAGNOSIS — R0602 Shortness of breath: Secondary | ICD-10-CM

## 2011-07-13 DIAGNOSIS — J449 Chronic obstructive pulmonary disease, unspecified: Secondary | ICD-10-CM

## 2011-07-13 DIAGNOSIS — J439 Emphysema, unspecified: Secondary | ICD-10-CM | POA: Insufficient documentation

## 2011-07-13 LAB — PULMONARY FUNCTION TEST

## 2011-07-13 NOTE — Patient Instructions (Signed)
Stay on spiriva one inhalation each am Trial of symbicort 160/4.5  2 inhalations am and pm.  Rinse mouth well. Use albuterol for rescue only Work on exercise program and weight loss.  followup with me in 4mos

## 2011-07-13 NOTE — Progress Notes (Signed)
  Subjective:    Patient ID: [name], male    DOB: [birth year], 65 y.o.   MRN: [id number]  HPI The patient comes in today for followup of his pulmonary function studies, ordered as part of a workup for dyspnea on exertion.  His chest x-ray last visit was unremarkable.  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  I reviewed the PFTs with he and his family member in detail, and answered all their questions.   Review of Systems  Constitutional: Negative for fever and unexpected weight change.  HENT: Positive for rhinorrhea. Negative for ear pain, nosebleeds, congestion, sore throat, sneezing, trouble swallowing, dental problem, postnasal drip and sinus pressure.   Eyes: Negative for redness and itching.  Respiratory: Negative for cough, chest tightness, shortness of breath and wheezing.   Cardiovascular: Negative for palpitations and leg swelling.  Gastrointestinal: Negative for nausea and vomiting.  Genitourinary: Negative for dysuria.  Musculoskeletal: Negative for joint swelling.  Skin: Negative for rash.  Neurological: Negative for headaches.  Hematological: Does not bruise/bleed easily.  Psychiatric/Behavioral: Negative for dysphoric mood. The patient is not nervous/anxious.        Objective:   Physical Exam Well developed male in no acute distress Nose without purulence or discharge Chest with mildly decreased breath sounds, but no wheezes or rhonchi Cardiac exam with regular rate and rhythm Lower extremities without significant edema, no cyanosis noted Alert and oriented, moves all 4 extremities.       Assessment & Plan:

## 2011-07-13 NOTE — Assessment & Plan Note (Signed)
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  He is currently on Spiriva, but I think that we should give him a trial of a LABA/ICS.  I have also stressed the importance of a conditioning program and weight loss.  I have offered to refer him to a pulmonary rehabilitation program, but he is unable to afford at this time.

## 2011-07-13 NOTE — Progress Notes (Signed)
PFT done today. 

## 2011-07-13 NOTE — Assessment & Plan Note (Signed)
The patient has multifactorial dyspnea is related to his moderate obstructive disease, known cardiac disease, and also his obesity and deconditioning.

## 2011-07-20 ENCOUNTER — Other Ambulatory Visit (INDEPENDENT_AMBULATORY_CARE_PROVIDER_SITE_OTHER): Payer: Medicare Other | Admitting: *Deleted

## 2011-07-20 DIAGNOSIS — E785 Hyperlipidemia, unspecified: Secondary | ICD-10-CM

## 2011-07-20 LAB — LIPID PANEL
Cholesterol: 92 mg/dL (ref 0–200)
LDL Cholesterol: 42 mg/dL (ref 0–99)
VLDL: 12.6 mg/dL (ref 0.0–40.0)

## 2011-07-20 LAB — HEPATIC FUNCTION PANEL
ALT: 16 U/L (ref 0–53)
AST: 17 U/L (ref 0–37)
Albumin: 3.8 g/dL (ref 3.5–5.2)
Alkaline Phosphatase: 62 U/L (ref 39–117)

## 2011-08-10 ENCOUNTER — Ambulatory Visit (INDEPENDENT_AMBULATORY_CARE_PROVIDER_SITE_OTHER): Payer: Medicare Other | Admitting: Cardiology

## 2011-08-10 ENCOUNTER — Encounter: Payer: Self-pay | Admitting: Cardiology

## 2011-08-10 DIAGNOSIS — E7800 Pure hypercholesterolemia, unspecified: Secondary | ICD-10-CM

## 2011-08-10 DIAGNOSIS — E785 Hyperlipidemia, unspecified: Secondary | ICD-10-CM

## 2011-08-10 DIAGNOSIS — I10 Essential (primary) hypertension: Secondary | ICD-10-CM

## 2011-08-10 DIAGNOSIS — I2510 Atherosclerotic heart disease of native coronary artery without angina pectoris: Secondary | ICD-10-CM

## 2011-08-10 MED ORDER — ATORVASTATIN CALCIUM 20 MG PO TABS: 20.0000 mg | ORAL_TABLET | Freq: Every day | ORAL | Status: DC

## 2011-08-10 NOTE — Assessment & Plan Note (Signed)
Had BMS to RCA, and Promus Element Plus to CFX.  At six months, will switch to clopidogrel to lower costs.

## 2011-08-10 NOTE — Patient Instructions (Signed)
Your physician has recommended you make the following change in your medication: STOP Crestor, START Lipitor (atorvastatin) 20mg  one by mouth every evening   Your physician recommends that you return for a FASTING LIPID and LIVER Profile in 6 WEEKS (414.01, 401.9, 272.0)----Nothing to eat or drink after midnight, lab opens at 8:30  Your physician has requested that you regularly monitor and record your blood pressure readings at home. Please use the same machine at the same time of day to check your readings and record them to bring to your follow-up visit.  Your physician recommends that you schedule a follow-up appointment in: 3 MONTHS

## 2011-08-10 NOTE — Assessment & Plan Note (Addendum)
Has cuff so I asked him to keep a log of what the pressures were.  If they remain up, may increase Lisinopril.

## 2011-08-10 NOTE — Progress Notes (Signed)
HPI:  He is doing very well.  No chest pain or increased shortness of breath.  Wants to do more, and would like to change some meds.  He was at goal when lipids were checked.  Breathing is improved with inhaler.  Dr. Teddy Spike note was reviewed.    Current Outpatient Prescriptions  Medication Sig Dispense Refill  . albuterol (PROAIR HFA) 108 (90 BASE) MCG/ACT inhaler Inhale 2 puffs into the lungs every 6 (six) hours as needed for wheezing.  1 Inhaler  6  . aspirin 81 MG tablet Take 81 mg by mouth daily.        . carvedilol (COREG) 12.5 MG tablet Take 12.5 mg by mouth 2 (two) times daily with a meal.        . diphenhydrAMINE (BENADRYL) 25 mg capsule Take 25 mg by mouth at bedtime.        . Garlic 1000 MG CAPS Take 1 capsule by mouth daily.        Marland Kitchen lisinopril (PRINIVIL,ZESTRIL) 10 MG tablet Take 10 mg by mouth daily.        . Omega-3 Fatty Acids (FISH OIL) 1000 MG CAPS Take 2 capsules by mouth daily.        Marland Kitchen omeprazole (PRILOSEC OTC) 20 MG tablet Take 20 mg by mouth daily.        . prasugrel (EFFIENT) 10 MG TABS Take 1 tablet (10 mg total) by mouth daily.  30 tablet  6  . rosuvastatin (CRESTOR) 40 MG tablet Take 40 mg by mouth daily.        Marland Kitchen tiotropium (SPIRIVA HANDIHALER) 18 MCG inhalation capsule Place 1 capsule (18 mcg total) into inhaler and inhale daily.  1 capsule  3    Allergies  Allergen Reactions  . Codeine     Past Medical History  Diagnosis Date  . COPD (chronic obstructive pulmonary disease)     ?  Marland Kitchen Paroxysmal atrial fibrillation   . 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 Ladona Ridgel 6/10  . Syncope and collapse   . History of migraines   . GERD (gastroesophageal reflux disease)   . Dyspnea   . Hypertension   . Allergic rhinitis   . HLD (hyperlipidemia)   . CAD (coronary artery disease)     a. s/p NSTEMI 6/12: c/b VT/VF arrest req. defib and amio IV rx., pRCA occluded at cath and treated with BMS;  staged PCI of mCFX 90% on 04/28/11 with Promus  DES;  residual CAD at cath 6/12: pLAD 20%, pCFX 40%, oOM1 70%, oRI 60-70%, EF 20-25%;   follow up Echo 6/12 with recovered EF:  EF 55-60%, mild LVH, mod LAE  . Heart attack 04/25/2011    Past Surgical History  Procedure Date  . Knee surgery 1971    Secondary to injury  . 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 Lewayne Bunting  . Heart stent placement 04/25/2011    Family History  Problem Relation Age of Onset  . Hypertension Mother   . Coronary artery disease Mother   . Diabetes Mother   . Heart disease Father   . Rheum arthritis Father   . Breast cancer Paternal Aunt     History   Social History  . Marital Status: Widowed    Spouse Name: N/A    Number of Children: 2  . Years of Education: N/A   Occupational History  . 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 Smoking status: Former Smoker -- 2.0 packs/day for 50 years    Quit date: 11/24/1999  . Smokeless tobacco: Never Used  . Alcohol Use: Yes     Occasional beer drinker and had moonshine 6 months ago.  . Drug Use: No  . Sexually Active: Not on file   Other Topics Concern  . Not on file   Social History Narrative   His wife died last 2023-08-16- widowed.Lives in [place] with his son.60-pack-year smoker but quit 10 years ago negative.    ROS: Please see the HPI.  All other systems reviewed and negative.  PHYSICAL EXAM:  BP 144/86  Pulse 74  Ht 5\' 8"  (1.727 m)  Wt 227 lb (102.967 kg)  BMI 34.52 kg/m2  General: Well developed, well nourished, in no acute distress. Head:  Normocephalic and atraumatic. Neck: no JVD Lungs: Clear to auscultation and percussion.  No real prolonged expiration. Heart: Normal S1 and S2.  No murmur, rubs or gallops.  Abdomen:  Normal bowel sounds; soft; non tender; no organomegaly Pulses: Pulses normal in all 4 extremities. Extremities: No clubbing or  cyanosis. No edema. Neurologic: Alert and oriented x 3.  EKG:  NSR.  Nonspecific ST and T wave changes.  Compared to prior tracing, some non specific changes noted.    ASSESSMENT AND PLAN:

## 2011-08-10 NOTE — Assessment & Plan Note (Signed)
LDL was llow, and he wants generic, so will switch to lower dose atorvastatin.  And then check in six weeks.

## 2011-08-13 LAB — DIFFERENTIAL
Basophils Absolute: 0.4 — ABNORMAL HIGH
Basophils Relative: 4 — ABNORMAL HIGH
Eosinophils Absolute: 0.2
Eosinophils Relative: 2
Lymphocytes Relative: 27
Lymphs Abs: 2.3
Monocytes Absolute: 0.7
Monocytes Relative: 8
Neutro Abs: 5.1
Neutrophils Relative %: 59

## 2011-08-13 LAB — POCT CARDIAC MARKERS
CKMB, poc: 1.8
Myoglobin, poc: 123
Operator id: 151321
Troponin i, poc: <0.05

## 2011-08-13 LAB — BASIC METABOLIC PANEL WITH GFR
BUN: 13
CO2: 26
Calcium: 10
Chloride: 107
Creatinine, Ser: 1.13
GFR calc non Af Amer: >60
Glucose, Bld: 102 — ABNORMAL HIGH
Potassium: 4.3
Sodium: 140

## 2011-08-13 LAB — CBC
HCT: 46.3
Hemoglobin: 15.5
RBC: 5.12
RDW: 13

## 2011-09-08 ENCOUNTER — Telehealth: Payer: Self-pay | Admitting: Cardiology

## 2011-09-08 DIAGNOSIS — I10 Essential (primary) hypertension: Secondary | ICD-10-CM

## 2011-09-08 NOTE — Telephone Encounter (Signed)
I spoke with the pt and his BP yesterday was 181/91.  The pt said that his SBP has been running around 150 for the past two weeks and he has been having a headache everyday. Per Dr Rosalyn Charters last note he felt that the pt could increase Lisinopril if BP remained elevated.  I instructed the pt to increase Lisinopril to 20mg  daily and monitor his BP over the next few days.  I will call the pt on Friday to see how is BP is running.

## 2011-09-08 NOTE — Telephone Encounter (Signed)
Pt is having problem with BP being high.  Can he increase his medication or is there something else he can take/add?  Please call and advise.

## 2011-09-11 MED ORDER — LISINOPRIL 20 MG PO TABS: 20.0000 mg | ORAL_TABLET | Freq: Every day | ORAL | Status: DC

## 2011-09-11 NOTE — Telephone Encounter (Signed)
Per Dr Riley Kill he would like the pt to remain on Lisinopril 20mg , monitor BP and call the office next week with BP readings. I spoke with the pt and made him aware of Dr Rosalyn Charters recommendation.  A new Rx was sent to the pt's pharmacy for 20mg  Lisinopril.  The pt will continue to monitor his BP at home and he will call me at the end of next week to let me know his BP readings.  If the pt's BP has not improved then Dr Riley Kill would like to see him in the office before making further medication changes. Pt agreed with plan.

## 2011-09-11 NOTE — Telephone Encounter (Signed)
I spoke with the pt and his SBP is still running 150-160 on Lisinopril 20mg  daily (has only taken for 2 days).  I will discuss this pt with Dr Riley Kill and call the pt back today.

## 2011-09-17 ENCOUNTER — Emergency Department (HOSPITAL_COMMUNITY): Payer: Medicare Other

## 2011-09-17 ENCOUNTER — Emergency Department (HOSPITAL_COMMUNITY)
Admission: EM | Admit: 2011-09-17 | Discharge: 2011-09-17 | Disposition: A | Discharge status: Home / Self Care | Payer: Medicare Other | Attending: Emergency Medicine | Admitting: Emergency Medicine

## 2011-09-17 DIAGNOSIS — Z79899 Other long term (current) drug therapy: Secondary | ICD-10-CM | POA: Insufficient documentation

## 2011-09-17 DIAGNOSIS — N289 Disorder of kidney and ureter, unspecified: Secondary | ICD-10-CM | POA: Insufficient documentation

## 2011-09-17 DIAGNOSIS — I2510 Atherosclerotic heart disease of native coronary artery without angina pectoris: Secondary | ICD-10-CM | POA: Insufficient documentation

## 2011-09-17 DIAGNOSIS — I252 Old myocardial infarction: Secondary | ICD-10-CM | POA: Insufficient documentation

## 2011-09-17 DIAGNOSIS — X58XXXA Exposure to other specified factors, initial encounter: Secondary | ICD-10-CM | POA: Insufficient documentation

## 2011-09-17 DIAGNOSIS — I10 Essential (primary) hypertension: Secondary | ICD-10-CM | POA: Insufficient documentation

## 2011-09-17 DIAGNOSIS — E789 Disorder of lipoprotein metabolism, unspecified: Secondary | ICD-10-CM | POA: Insufficient documentation

## 2011-09-17 DIAGNOSIS — M25519 Pain in unspecified shoulder: Secondary | ICD-10-CM | POA: Insufficient documentation

## 2011-09-17 DIAGNOSIS — M549 Dorsalgia, unspecified: Secondary | ICD-10-CM | POA: Insufficient documentation

## 2011-09-17 DIAGNOSIS — T148XXA Other injury of unspecified body region, initial encounter: Secondary | ICD-10-CM | POA: Insufficient documentation

## 2011-09-17 LAB — CBC
HCT: 38.8 % — ABNORMAL LOW (ref 39.0–52.0)
Hemoglobin: 13.1 g/dL (ref 13.0–17.0)
MCV: 87 fL (ref 78.0–100.0)
RBC: 4.46 MIL/uL (ref 4.22–5.81)
RDW: 13.1 % (ref 11.5–15.5)
WBC: 9.5 10*3/uL (ref 4.0–10.5)

## 2011-09-17 LAB — COMPREHENSIVE METABOLIC PANEL
ALT: 15 U/L (ref 0–53)
AST: 17 U/L (ref 0–37)
Alkaline Phosphatase: 76 U/L (ref 39–117)
CO2: 27 mEq/L (ref 19–32)
Calcium: 9 mg/dL (ref 8.4–10.5)
GFR calc non Af Amer: 48 mL/min — ABNORMAL LOW (ref >90)
Glucose, Bld: 98 mg/dL (ref 70–99)
Potassium: 3.9 mEq/L (ref 3.5–5.1)
Sodium: 143 mEq/L (ref 135–145)

## 2011-09-17 LAB — DIFFERENTIAL
Basophils Absolute: 0.1 10*3/uL (ref 0.0–0.1)
Lymphocytes Relative: 25 % (ref 12–46)
Lymphs Abs: 2.4 10*3/uL (ref 0.7–4.0)
Neutro Abs: 5.8 10*3/uL (ref 1.7–7.7)
Neutrophils Relative %: 61 % (ref 43–77)

## 2011-09-17 LAB — POCT I-STAT TROPONIN I

## 2011-09-18 ENCOUNTER — Telehealth: Payer: Self-pay | Admitting: Cardiology

## 2011-09-18 DIAGNOSIS — I10 Essential (primary) hypertension: Secondary | ICD-10-CM

## 2011-09-18 NOTE — Telephone Encounter (Signed)
[name] states his BP has been running around 141-175/80-85 since the change in his BP meds.  He also states his headaches are no better.   He will write down the actual readings and bring them in when he comes in for labs next week.

## 2011-09-18 NOTE — Telephone Encounter (Signed)
Pt's son called about his father's blood pressure and the blood pressure medis not bringing it down 140/80 please call back

## 2011-09-21 ENCOUNTER — Other Ambulatory Visit (INDEPENDENT_AMBULATORY_CARE_PROVIDER_SITE_OTHER): Payer: Medicare Other | Admitting: *Deleted

## 2011-09-21 DIAGNOSIS — I10 Essential (primary) hypertension: Secondary | ICD-10-CM

## 2011-09-21 DIAGNOSIS — E7800 Pure hypercholesterolemia, unspecified: Secondary | ICD-10-CM

## 2011-09-21 DIAGNOSIS — I2510 Atherosclerotic heart disease of native coronary artery without angina pectoris: Secondary | ICD-10-CM

## 2011-09-21 LAB — HEPATIC FUNCTION PANEL
Albumin: 3.6 g/dL (ref 3.5–5.2)
Alkaline Phosphatase: 62 U/L (ref 39–117)
Total Bilirubin: 0.4 mg/dL (ref 0.3–1.2)

## 2011-09-21 LAB — LIPID PANEL
HDL: 31.9 mg/dL — ABNORMAL LOW (ref >39.00)
LDL Cholesterol: 39 mg/dL (ref 0–99)
Total CHOL/HDL Ratio: 3
Triglycerides: 98 mg/dL (ref 0.0–149.0)

## 2011-09-21 NOTE — Telephone Encounter (Signed)
The pt's son brought in BP readings.  I will have Dr Riley Kill review these readings on 09/22/11.

## 2011-09-22 ENCOUNTER — Encounter: Payer: Self-pay | Admitting: Cardiology

## 2011-09-22 NOTE — Telephone Encounter (Signed)
This encounter was created in error - please disregard.

## 2011-09-22 NOTE — Telephone Encounter (Signed)
Pt's son called and said the med is not working It is still high please call

## 2011-09-22 NOTE — Telephone Encounter (Signed)
Melissa S Hethcox  Signed  09/22/2011 2:18 PM  Bookmark Copy    Pt's son called and said the med is not working It is still high please call

## 2011-09-22 NOTE — Telephone Encounter (Signed)
Per Dr Riley Kill he would like the pt to start HCTZ 12.5mg  one daily in addition to his other medications.   The pt will need a BMP checked in 1 WEEK. The pt should continue to monitor his BP at home. Rx called into pharmacy (#30, refills x3).  I made the pt's son aware of this information. BP readings 148-176/60-92.

## 2011-09-23 NOTE — Telephone Encounter (Signed)
Agree with the above plan. Discussed in clinic.  TS

## 2011-09-27 ENCOUNTER — Emergency Department (HOSPITAL_COMMUNITY)
Admission: EM | Admit: 2011-09-27 | Discharge: 2011-09-27 | Discharge status: Against Medical Advice | Payer: Medicare Other

## 2011-09-30 ENCOUNTER — Ambulatory Visit (INDEPENDENT_AMBULATORY_CARE_PROVIDER_SITE_OTHER): Payer: Medicare Other | Admitting: *Deleted

## 2011-09-30 DIAGNOSIS — I10 Essential (primary) hypertension: Secondary | ICD-10-CM

## 2011-09-30 LAB — BASIC METABOLIC PANEL
CO2: 28 mEq/L (ref 19–32)
Chloride: 105 mEq/L (ref 96–112)
Glucose, Bld: 95 mg/dL (ref 70–99)
Sodium: 140 mEq/L (ref 135–145)

## 2011-10-02 ENCOUNTER — Encounter: Payer: Self-pay | Admitting: Cardiology

## 2011-10-02 ENCOUNTER — Ambulatory Visit (INDEPENDENT_AMBULATORY_CARE_PROVIDER_SITE_OTHER): Payer: Medicare Other | Admitting: Cardiology

## 2011-10-02 VITALS — BP 131/77 | HR 75 | Ht 68.0 in | Wt 225.0 lb

## 2011-10-02 DIAGNOSIS — E785 Hyperlipidemia, unspecified: Secondary | ICD-10-CM

## 2011-10-02 DIAGNOSIS — I10 Essential (primary) hypertension: Secondary | ICD-10-CM

## 2011-10-02 DIAGNOSIS — I2510 Atherosclerotic heart disease of native coronary artery without angina pectoris: Secondary | ICD-10-CM

## 2011-10-02 NOTE — Patient Instructions (Signed)
Your physician has recommended you make the following change in your medication: INCREASE Carvedilol 12.5mg  take one and one-half tablet by mouth twice a day, STOP HCTZ  Your physician has requested that you regularly monitor and record your blood pressure readings at home. Please use the same machine at the same time of day to check your readings and record them to bring to your follow-up visit.  Your physician recommends that you return for lab work: [facility] (Tuesday 10/06/11)   Your physician recommends that you schedule a follow-up appointment in: 2 MONTHS

## 2011-10-06 ENCOUNTER — Ambulatory Visit (INDEPENDENT_AMBULATORY_CARE_PROVIDER_SITE_OTHER): Payer: Medicare Other | Admitting: *Deleted

## 2011-10-06 DIAGNOSIS — I10 Essential (primary) hypertension: Secondary | ICD-10-CM

## 2011-10-06 LAB — BASIC METABOLIC PANEL
BUN: 23 mg/dL (ref 6–23)
Chloride: 108 mEq/L (ref 96–112)
Glucose, Bld: 84 mg/dL (ref 70–99)
Potassium: 4.3 mEq/L (ref 3.5–5.1)

## 2011-10-11 NOTE — Assessment & Plan Note (Signed)
Tolerating current meds.  No present symptoms of progressive CAD.

## 2011-10-11 NOTE — Progress Notes (Signed)
HPI:  He is doing fine, but his Cr was up after starting HCTZ.  We brought him in today to adjust BP meds.    Current Outpatient Prescriptions  Medication Sig Dispense Refill  . albuterol (PROAIR HFA) 108 (90 BASE) MCG/ACT inhaler Inhale 2 puffs into the lungs every 6 (six) hours as needed for wheezing.  1 Inhaler  6  . aspirin 81 MG tablet Take 81 mg by mouth daily.        Marland Kitchen atorvastatin (LIPITOR) 20 MG tablet Take 1 tablet (20 mg total) by mouth daily.  30 tablet  6  . carvedilol (COREG) 12.5 MG tablet Take one and one-half tablet by mouth twice a day      . diphenhydrAMINE (BENADRYL) 25 mg capsule Take 25 mg by mouth at bedtime.        . Garlic 1000 MG CAPS Take 1 capsule by mouth daily.        Marland Kitchen lisinopril (PRINIVIL,ZESTRIL) 20 MG tablet Take 1 tablet (20 mg total) by mouth daily.  30 tablet  3  . Omega-3 Fatty Acids (FISH OIL) 1000 MG CAPS Take 2 capsules by mouth daily.        Marland Kitchen omeprazole (PRILOSEC OTC) 20 MG tablet Take 20 mg by mouth daily.        . prasugrel (EFFIENT) 10 MG TABS Take 1 tablet (10 mg total) by mouth daily.  30 tablet  6  . tiotropium (SPIRIVA HANDIHALER) 18 MCG inhalation capsule Place 1 capsule (18 mcg total) into inhaler and inhale daily.  1 capsule  3    Allergies  Allergen Reactions  . Codeine     Past Medical History  Diagnosis Date  . COPD (chronic obstructive pulmonary disease)     ?  Marland Kitchen Paroxysmal atrial fibrillation   . 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 Ladona Ridgel 6/10  . Syncope and collapse   . History of migraines   . GERD (gastroesophageal reflux disease)   . Dyspnea   . Hypertension   . Allergic rhinitis   . HLD (hyperlipidemia)   . CAD (coronary artery disease)     a. s/p NSTEMI 6/12: c/b VT/VF arrest req. defib and amio IV rx., pRCA occluded at cath and treated with BMS;  staged PCI of mCFX 90% on 04/28/11 with Promus DES;  residual CAD at cath 6/12: pLAD 20%, pCFX 40%, oOM1 70%, oRI 60-70%, EF 20-25%;   follow  up Echo 6/12 with recovered EF:  EF 55-60%, mild LVH, mod LAE  . Heart attack 04/25/2011    Past Surgical History  Procedure Date  . Knee surgery 1971    Secondary to injury  . 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 Lewayne Bunting  . Heart stent placement 04/25/2011    Family History  Problem Relation Age of Onset  . Hypertension Mother   . Coronary artery disease Mother   . Diabetes Mother   . Heart disease Father   . Rheum arthritis Father   . Breast cancer Paternal Aunt     History   Social History  . Marital Status: Widowed    Spouse Name: N/A    Number of Children: 2  . Years of Education: N/A   Occupational History  . 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 Smoking status: Former  Smoker -- 2.0 packs/day for 50 years    Quit date: 11/24/1999  . Smokeless tobacco: Never Used  . Alcohol Use: Yes     Occasional beer drinker and had moonshine 6 months ago.  . Drug Use: No  . Sexually Active: Not on file   Other Topics Concern  . Not on file   Social History Narrative   His wife died last 08-25-23- widowed.Lives in [place] with his son.60-pack-year smoker but quit 10 years ago negative.    ROS: Please see the HPI.  All other systems reviewed and negative.  PHYSICAL EXAM:  BP 131/77  Pulse 75  Ht 5\' 8"  (1.727 m)  Wt 102.059 kg (225 lb)  BMI 34.21 kg/m2  General: Well developed, well nourished, in no acute distress. Head:  Normocephalic and atraumatic. Neck: no JVD Lungs: Clear to auscultation and percussion. Heart: Normal S1 and S2.  No murmur, rubs or gallops.  Abdomen:  Normal bowel sounds; soft; non tender; no organomegaly Pulses: Pulses normal in all 4 extremities. Extremities: No clubbing or cyanosis. No edema. Neurologic: Alert and oriented x 3.  EKG:  ASSESSMENT AND PLAN:

## 2011-10-11 NOTE — Assessment & Plan Note (Addendum)
With rise in Cr on HCTZ, will hold and recheck labs next week.  Will increase Carvedilol to one and one half tabs, or 18.75mg  per day, and then reassess the situation.  He is stable at present, and given his vitals, with HR of 75, should tolerate this just fine.  No major symptoms at present.

## 2011-10-11 NOTE — Assessment & Plan Note (Signed)
Last LDL was at target. TS

## 2011-10-19 ENCOUNTER — Ambulatory Visit (INDEPENDENT_AMBULATORY_CARE_PROVIDER_SITE_OTHER): Payer: Medicare Other | Admitting: *Deleted

## 2011-10-19 DIAGNOSIS — I2510 Atherosclerotic heart disease of native coronary artery without angina pectoris: Secondary | ICD-10-CM

## 2011-10-19 LAB — BASIC METABOLIC PANEL
BUN: 17 mg/dL (ref 6–23)
CO2: 27 mEq/L (ref 19–32)
Calcium: 8.8 mg/dL (ref 8.4–10.5)
Creatinine, Ser: 1.6 mg/dL — ABNORMAL HIGH (ref 0.4–1.5)
Glucose, Bld: 107 mg/dL — ABNORMAL HIGH (ref 70–99)

## 2011-10-22 ENCOUNTER — Telehealth: Payer: Self-pay | Admitting: Cardiology

## 2011-10-22 NOTE — Telephone Encounter (Signed)
I spoke with the pt's son and made him aware of the pt's BMP results.

## 2011-10-22 NOTE — Telephone Encounter (Signed)
Fu Call: Pt son returning call to our office. Please call back to discuss further.

## 2011-11-01 ENCOUNTER — Other Ambulatory Visit: Payer: Self-pay | Admitting: Physician Assistant

## 2011-11-02 ENCOUNTER — Other Ambulatory Visit: Payer: Self-pay | Admitting: Physician Assistant

## 2011-11-04 ENCOUNTER — Other Ambulatory Visit: Payer: Self-pay | Admitting: *Deleted

## 2011-11-04 ENCOUNTER — Telehealth: Payer: Self-pay | Admitting: Cardiology

## 2011-11-04 DIAGNOSIS — I10 Essential (primary) hypertension: Secondary | ICD-10-CM

## 2011-11-04 MED ORDER — CARVEDILOL 12.5 MG PO TABS: ORAL_TABLET | ORAL | Status: DC

## 2011-11-04 NOTE — Telephone Encounter (Signed)
Patient's son said Carvedilol dose was changed to 1 and  1/2 tablet twice a day, Pharmacy needs to be call regarding the change. Dose changes was verified by Dr. Riley Kill. Prescription called to [facility], Patient's son aware.

## 2011-11-04 NOTE — Telephone Encounter (Signed)
New msg Pt's son called He said prescription was changed to 11/2 in morning and at night. He wants to verify dosage. Please call

## 2011-11-10 ENCOUNTER — Ambulatory Visit (INDEPENDENT_AMBULATORY_CARE_PROVIDER_SITE_OTHER): Payer: Medicare Other | Admitting: Cardiology

## 2011-11-10 ENCOUNTER — Encounter: Payer: Self-pay | Admitting: Cardiology

## 2011-11-10 DIAGNOSIS — I10 Essential (primary) hypertension: Secondary | ICD-10-CM

## 2011-11-10 DIAGNOSIS — R0602 Shortness of breath: Secondary | ICD-10-CM

## 2011-11-10 DIAGNOSIS — I2510 Atherosclerotic heart disease of native coronary artery without angina pectoris: Secondary | ICD-10-CM

## 2011-11-10 DIAGNOSIS — R0683 Snoring: Secondary | ICD-10-CM

## 2011-11-10 DIAGNOSIS — E785 Hyperlipidemia, unspecified: Secondary | ICD-10-CM

## 2011-11-10 LAB — BASIC METABOLIC PANEL
BUN: 19 mg/dL (ref 6–23)
Calcium: 9 mg/dL (ref 8.4–10.5)
Creatinine, Ser: 1.7 mg/dL — ABNORMAL HIGH (ref 0.4–1.5)
GFR: 42.54 mL/min — ABNORMAL LOW (ref >60.00)

## 2011-11-10 MED ORDER — AMLODIPINE BESYLATE 5 MG PO TABS: 5.0000 mg | ORAL_TABLET | Freq: Every day | ORAL | Status: DC

## 2011-11-10 NOTE — Patient Instructions (Signed)
Your physician has recommended you make the following change in your medication: START Amlodipine 5mg  take one by mouth daily  Your physician recommends that you schedule a follow-up appointment in: 2 MONTHS  Your physician recommends that you have lab work today: [facility]  Your physician has recommended that you have a sleep study. This test records several body functions during sleep, including: brain activity, eye movement, oxygen and carbon dioxide blood levels, heart rate and rhythm, breathing rate and rhythm, the flow of air through your mouth and nose, snoring, body muscle movements, and chest and belly movement.

## 2011-11-12 ENCOUNTER — Ambulatory Visit (INDEPENDENT_AMBULATORY_CARE_PROVIDER_SITE_OTHER): Payer: Medicare Other | Admitting: Pulmonary Disease

## 2011-11-12 ENCOUNTER — Encounter: Payer: Self-pay | Admitting: Pulmonary Disease

## 2011-11-12 VITALS — BP 142/88 | HR 63 | Temp 98.6°F | Ht 68.0 in | Wt 229.4 lb

## 2011-11-12 DIAGNOSIS — J4489 Other specified chronic obstructive pulmonary disease: Secondary | ICD-10-CM

## 2011-11-12 DIAGNOSIS — J449 Chronic obstructive pulmonary disease, unspecified: Secondary | ICD-10-CM

## 2011-11-12 NOTE — Progress Notes (Signed)
  Subjective:    Patient ID: [name], male    DOB: 1946/05/06, 65 y.o.   MRN: [id number]  HPI The patient comes in today for followup of his known COPD.  At the last visit, symbicort was added to his Spiriva as a trial.  He has noticed a significant improvement in his breathing with this.  He continues to have some degree of shortness of breath at times, but I have reminded him that he does have moderate obstructive disease as well as other medical issues that can contribute to this.  He denies any significant cough, congestion, or mucus production.   Review of Systems  Constitutional: Negative for fever and unexpected weight change.  HENT: Positive for rhinorrhea. Negative for ear pain, nosebleeds, congestion, sore throat, sneezing, trouble swallowing, dental problem, postnasal drip and sinus pressure.   Eyes: Positive for itching. Negative for redness.  Respiratory: Positive for wheezing. Negative for cough, chest tightness and shortness of breath.   Cardiovascular: Negative for palpitations and leg swelling.  Gastrointestinal: Negative for nausea and vomiting.  Genitourinary: Negative for dysuria.  Musculoskeletal: Negative for joint swelling.  Skin: Negative for rash.  Neurological: Negative for headaches.  Hematological: Bruises/bleeds easily.  Psychiatric/Behavioral: Negative for dysphoric mood. The patient is not nervous/anxious.        Objective:   Physical Exam Overweight male in no acute distress Nose without purulence or discharge noted Chest with mild decrease in breath sounds, no wheezes or rhonchi Cardiac exam with regular rate and rhythm Lower extremities with no edema, no cyanosis noted Alert and oriented, moves all 4 extremities.       Assessment & Plan:

## 2011-11-12 NOTE — Patient Instructions (Signed)
No change in medications Stay active, work on weight loss followup with me in 6mos.  

## 2011-11-12 NOTE — Assessment & Plan Note (Signed)
The patient is doing well on his current bronchodilator regimen.  He has not had any recent acute exacerbation or pulmonary infection.  I have asked him to continue on his current medication, and to work aggressively on weight loss and conditioning.  He will followup with me in 6 months if doing well.

## 2011-11-22 NOTE — Assessment & Plan Note (Addendum)
HCTZ caused Cr to go up.  Will add amlodipine at 5mg  daily, and then recheck BP over time.  BMET today. Continue lisinopril. Get sleep study.

## 2011-11-22 NOTE — Progress Notes (Signed)
HPI:  He is doing pretty well.  BP is a bit difficult to control.  Cr went up after HCTZ, so we cut back.  He currently is stable without complaints. Admits that he does snore.    Current Outpatient Prescriptions  Medication Sig Dispense Refill  . albuterol (PROAIR HFA) 108 (90 BASE) MCG/ACT inhaler Inhale 2 puffs into the lungs every 6 (six) hours as needed for wheezing.  1 Inhaler  6  . aspirin 81 MG tablet Take 81 mg by mouth daily.        Marland Kitchen atorvastatin (LIPITOR) 20 MG tablet Take 1 tablet (20 mg total) by mouth daily.  30 tablet  6  . carvedilol (COREG) 12.5 MG tablet Take 1 and 1/2 tablet twice a day.  90 tablet  6  . diphenhydrAMINE (BENADRYL) 25 mg capsule Take 25 mg by mouth at bedtime.        . Garlic 1000 MG CAPS Take 1 capsule by mouth daily.        Marland Kitchen lisinopril (PRINIVIL,ZESTRIL) 20 MG tablet Take 1 tablet (20 mg total) by mouth daily.  30 tablet  3  . Omega-3 Fatty Acids (FISH OIL) 1000 MG CAPS Take 2 capsules by mouth daily.        Marland Kitchen omeprazole (PRILOSEC OTC) 20 MG tablet Take 20 mg by mouth daily.        . prasugrel (EFFIENT) 10 MG TABS Take 1 tablet (10 mg total) by mouth daily.  30 tablet  6  . tiotropium (SPIRIVA HANDIHALER) 18 MCG inhalation capsule Place 1 capsule (18 mcg total) into inhaler and inhale daily.  1 capsule  3  . amLODipine (NORVASC) 5 MG tablet Take 1 tablet (5 mg total) by mouth daily.  30 tablet  6  . budesonide-formoterol (SYMBICORT) 160-4.5 MCG/ACT inhaler Inhale 2 puffs into the lungs 2 (two) times daily.          Allergies  Allergen Reactions  . Codeine     Past Medical History  Diagnosis Date  . COPD (chronic obstructive pulmonary disease)     ?  Marland Kitchen Paroxysmal atrial fibrillation   . 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 Ladona Ridgel 6/10  . Syncope and collapse   . History of migraines   . GERD (gastroesophageal reflux disease)   . Dyspnea   . Hypertension   . Allergic rhinitis   . HLD (hyperlipidemia)   . CAD  (coronary artery disease)     a. s/p NSTEMI 6/12: c/b VT/VF arrest req. defib and amio IV rx., pRCA occluded at cath and treated with BMS;  staged PCI of mCFX 90% on 04/28/11 with Promus DES;  residual CAD at cath 6/12: pLAD 20%, pCFX 40%, oOM1 70%, oRI 60-70%, EF 20-25%;   follow up Echo 6/12 with recovered EF:  EF 55-60%, mild LVH, mod LAE  . Heart attack 04/25/2011    Past Surgical History  Procedure Date  . Knee surgery 1971    Secondary to injury  . 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 Lewayne Bunting  . Heart stent placement 04/25/2011    Family History  Problem Relation Age of Onset  . Hypertension Mother   . Coronary artery disease Mother   . Diabetes Mother   . Heart disease Father   . Rheum arthritis Father   . Breast cancer Paternal Aunt     History   Social  History  . Marital Status: Widowed    Spouse Name: N/A    Number of Children: 2  . Years of Education: N/A   Occupational History  . 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 Smoking status: Former Smoker -- 2.0 packs/day for 50 years    Types: Cigarettes    Quit date: 11/24/1999  . Smokeless tobacco: Never Used  . Alcohol Use: Yes     Occasional beer drinker and had moonshine 6 months ago.  . Drug Use: No  . Sexually Active: Not on file   Other Topics Concern  . Not on file   Social History Narrative   His wife died last 2023/08/20- widowed.Lives in [place] with his son.60-pack-year smoker but quit 10 years ago negative.    ROS: Please see the HPI.  All other systems reviewed and negative.  PHYSICAL EXAM:  BP 160/90  Pulse 72  Ht 5\' 8"  (1.727 m)  Wt 104.781 kg (231 lb)  BMI 35.12 kg/m2  Checked by me 148/85, then 148/80.  General: Well developed, well nourished, in no acute distress. Head:  Normocephalic and atraumatic. Neck: no JVD Lungs: Clear to auscultation  and percussion. Heart: Normal S1 and S2.  No murmur, rubs or gallops. S4 gallop Abdomen:  Normal bowel sounds; soft; non tender; no organomegaly Pulses: Pulses normal in all 4 extremities. Extremities: No clubbing or cyanosis. No edema. Neurologic: Alert and oriented x 3.  EKG:  ASSESSMENT AND PLAN:

## 2011-11-22 NOTE — Assessment & Plan Note (Signed)
No current angina at present.

## 2011-11-22 NOTE — Assessment & Plan Note (Signed)
Now well controlled 

## 2011-12-01 ENCOUNTER — Other Ambulatory Visit: Payer: Self-pay | Admitting: Physician Assistant

## 2011-12-02 ENCOUNTER — Ambulatory Visit (HOSPITAL_BASED_OUTPATIENT_CLINIC_OR_DEPARTMENT_OTHER): Payer: Medicare Other | Attending: Cardiology | Admitting: Radiology

## 2011-12-02 VITALS — Ht 68.0 in | Wt 222.0 lb

## 2011-12-02 DIAGNOSIS — I10 Essential (primary) hypertension: Secondary | ICD-10-CM

## 2011-12-02 DIAGNOSIS — G4733 Obstructive sleep apnea (adult) (pediatric): Secondary | ICD-10-CM | POA: Insufficient documentation

## 2011-12-02 DIAGNOSIS — R0683 Snoring: Secondary | ICD-10-CM

## 2011-12-04 NOTE — Telephone Encounter (Signed)
Unsure where this was done

## 2011-12-10 DIAGNOSIS — G4733 Obstructive sleep apnea (adult) (pediatric): Secondary | ICD-10-CM

## 2011-12-10 DIAGNOSIS — G4761 Periodic limb movement disorder: Secondary | ICD-10-CM

## 2011-12-10 NOTE — Procedures (Signed)
[name], [name]              ACCOUNT NO.:  [id number]  MEDICAL RECORD NO.:  [id number]          PATIENT TYPE:  OUT  LOCATION:  SLEEP CENTER                 FACILITY:  [facility]  PHYSICIAN:  Barbaraann Share, MD,FCCPDATE OF BIRTH:  Mar 29, 1946  DATE OF STUDY:  12/02/2011                           NOCTURNAL POLYSOMNOGRAM  REFERRING PHYSICIAN:  Arturo Morton. Riley Kill, MD, [facility]  REFERRING PHYSICIAN:  Shawnie Pons, MD  INDICATION FOR STUDY:  Hypersomnia with sleep apnea.  EPWORTH SCORE:  1.  SLEEP ARCHITECTURE:  The patient had a total sleep time of 374 minutes with no slow-wave sleep and only 111 minutes of REM.  Sleep onset latency was normal at 10 minutes, and REM onset was normal at 60 minutes.  Sleep efficiency was adequate at 92%.  RESPIRATORY DATA:  The patient was found to have 5 apneas and 47 obstructive hypopneas, giving him an apnea-hypopnea index of 8 events per hour.  These occurred primarily during REM, and in all body positions.  There was moderate snoring noted throughout.  The patient did not meet split night protocol secondary to the majority of his events occurring well after 1 a.m.  OXYGEN DATA:  There was O2 desaturation as low as 80% with the patient's obstructive events.  He actually spent 26 minutes the entire night less than 88%.  CARDIAC DATA:  Occasional PVC noted but no clinically significant arrhythmias were seen.  MOVEMENT/PARASOMNIA:  The patient had 742 limb movements, with 17 per hour resulting in arousal or awakening.  IMPRESSION/RECOMMENDATIONS: 1. Mild obstructive sleep apnea/hypopnea syndrome with an AHI of 8     events per hour and O2 desaturation transiently to 80%.  The     patient did not meet split night protocol secondary to the majority     of his events occurring well after 1 a.m.  Treatment for this     degree of sleep apnea can include a trial of weight loss alone,     upper airway surgery, dental appliance, and also CPAP.   Clinical     correlation is suggested. 2. Occasional PVC noted, but no clinically significant arrhythmias     were seen 3. Very large numbers of periodic limb movements with significant     sleep disruption.  This is very suggestive of a primary movement     disorder of sleep, however, chronic pain or neuropathy could also     lead to the limb     movements.  Clinical correlation is suggested.  This may be more of     a disruption to the patient's sleep than his mild sleep-disordered     breathing.     Barbaraann Share, MD,FCCP Diplomate, American Board of Sleep Medicine Electronically Signed    [facility]  D:  12/10/2011 08:48:12  T:  12/10/2011 10:13:35  Job:  [id number]

## 2011-12-16 ENCOUNTER — Telehealth: Payer: Self-pay

## 2011-12-16 NOTE — Telephone Encounter (Signed)
Jodie from [facility] called to make sure that from a cardiac standpoint the pt can proceed with Colonoscopy and anesthesia for procedure on 12/18/11. Dr Chales Abrahams will be performing procedure.  Fax [phone].

## 2011-12-16 NOTE — Telephone Encounter (Signed)
Note faxed.

## 2011-12-16 NOTE — Telephone Encounter (Signed)
Patient had a cardiac arrest at the hospital prior to PCI.  His colonoscopy should be done at the hospital, preferably [facility] where a cath lab with advanced facilities are available.  The endo lab there is across from the cath lab, and staff experienced in cardiac related procedures  (TEE).  .  That would be my recommendation given his past medical history.  See prior data (June cath procedure)  INDICATIONS: [name] is a 66 year old gentleman who presents  after mowing the grass with some chest pain. His pain resolved before  or after arriving in the emergency room. EKG was abnormal. There was  diffuse ST-segment depression. There was some mild ST elevation, but in  one lead only. There was contiguous in the interpretation of the  electrocardiogram was marked ST abnormality with possible lateral  subendocardial injury. He was seen promptly by Dr. Gala Romney, and  medical therapy was initiated. After careful consideration, Dr.  Gala Romney elected to recommend urgent cardiac catheterization to define  his anatomy. He was brought to the catheterization laboratory for  further evaluation. He underwent diagnostic catheterization by Dr.  Gala Romney after an episode of ventricular tachycardia requiring  cardioversion. Catheterization demonstrated a large circumflex system  with about an 80-90% focal lesion, with the circumflex system providing  most of the inferior wall. There was calcified proximal LAD disease,  but no critical narrowing and modest ramus intermedius stenosis. The  RCA was totally occluded. However, there was still no evidence of ST  elevation noted on the monitor. The patient was given amiodarone.  Preparations were made for urgent percutaneous intervention.  Because he is within a year of this event, would favor hospital related facility for endo.         [name] 5:43 PM 12/16/2011

## 2012-01-04 IMAGING — CR DG CHEST 2V
2 series · 2 of 2 positions shown · non-contrast
Comparison: 04/25/2011

CLINICAL DATA: dyspnea

CHEST - 2 VIEW

[view not recorded (1 of 2)]
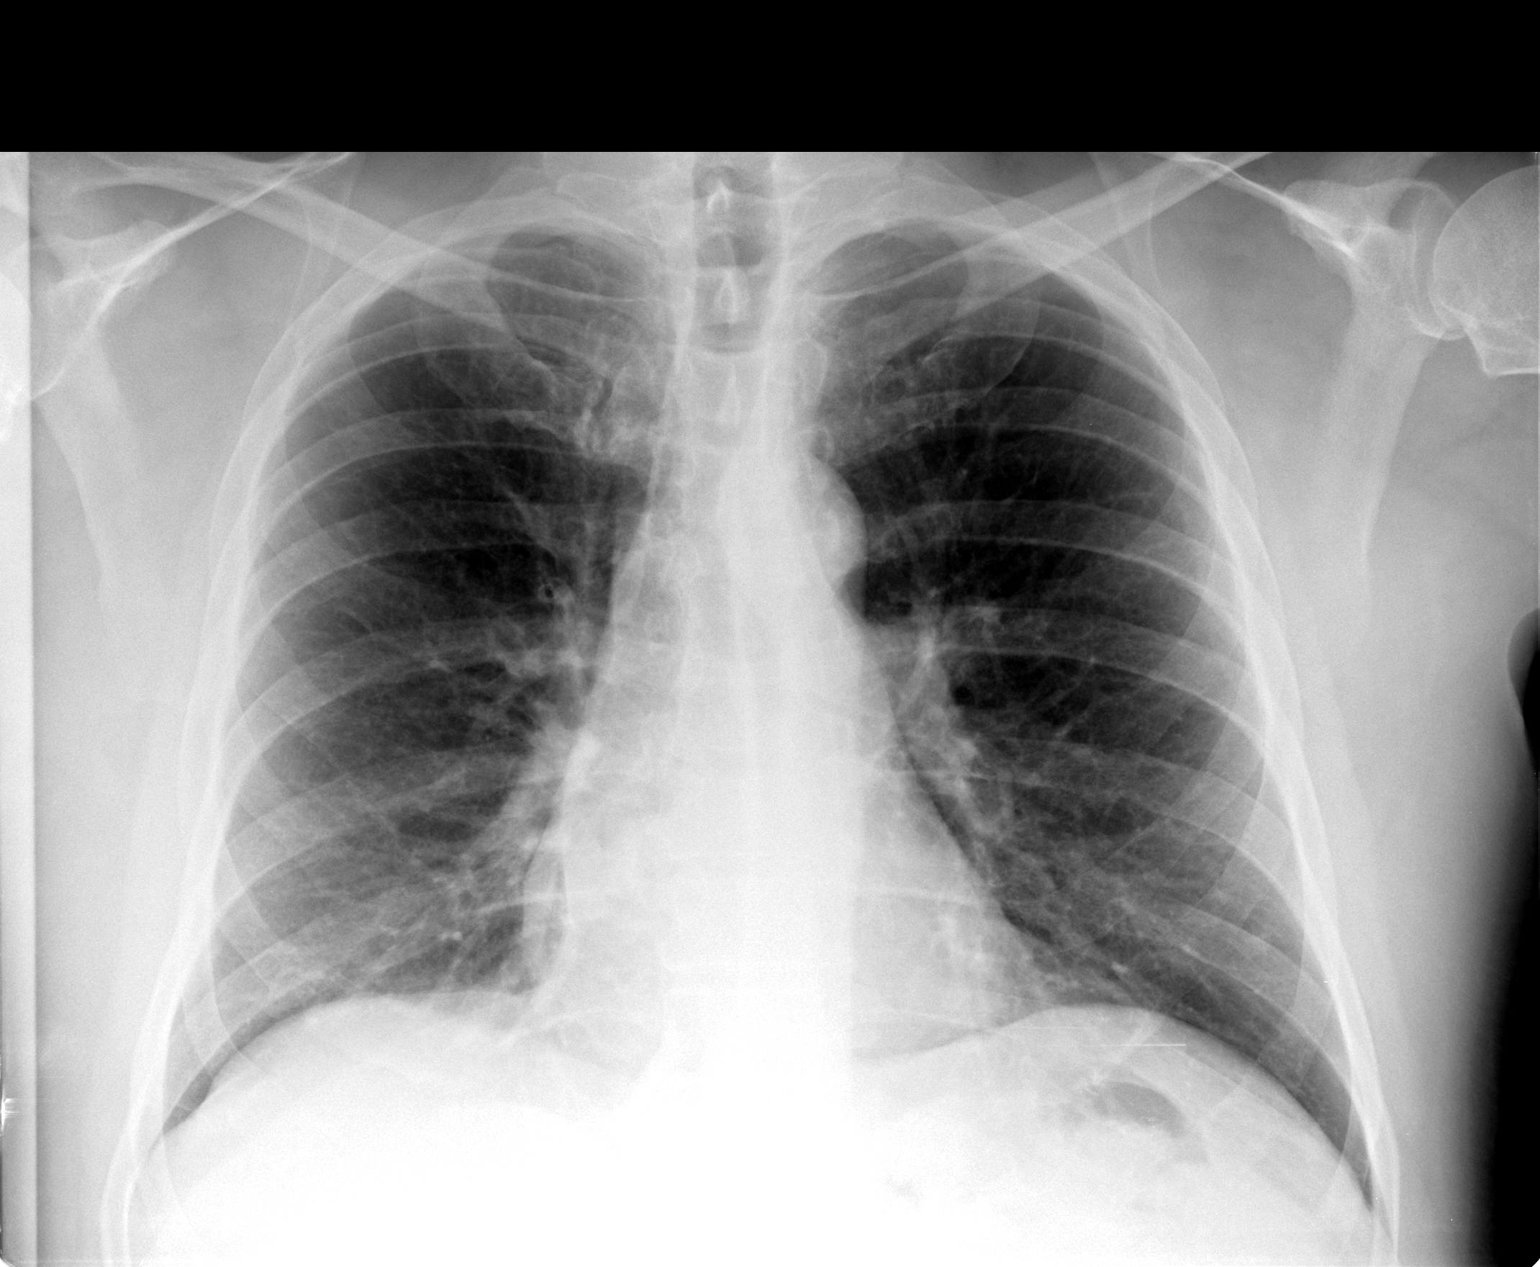

[view not recorded (2 of 2)]
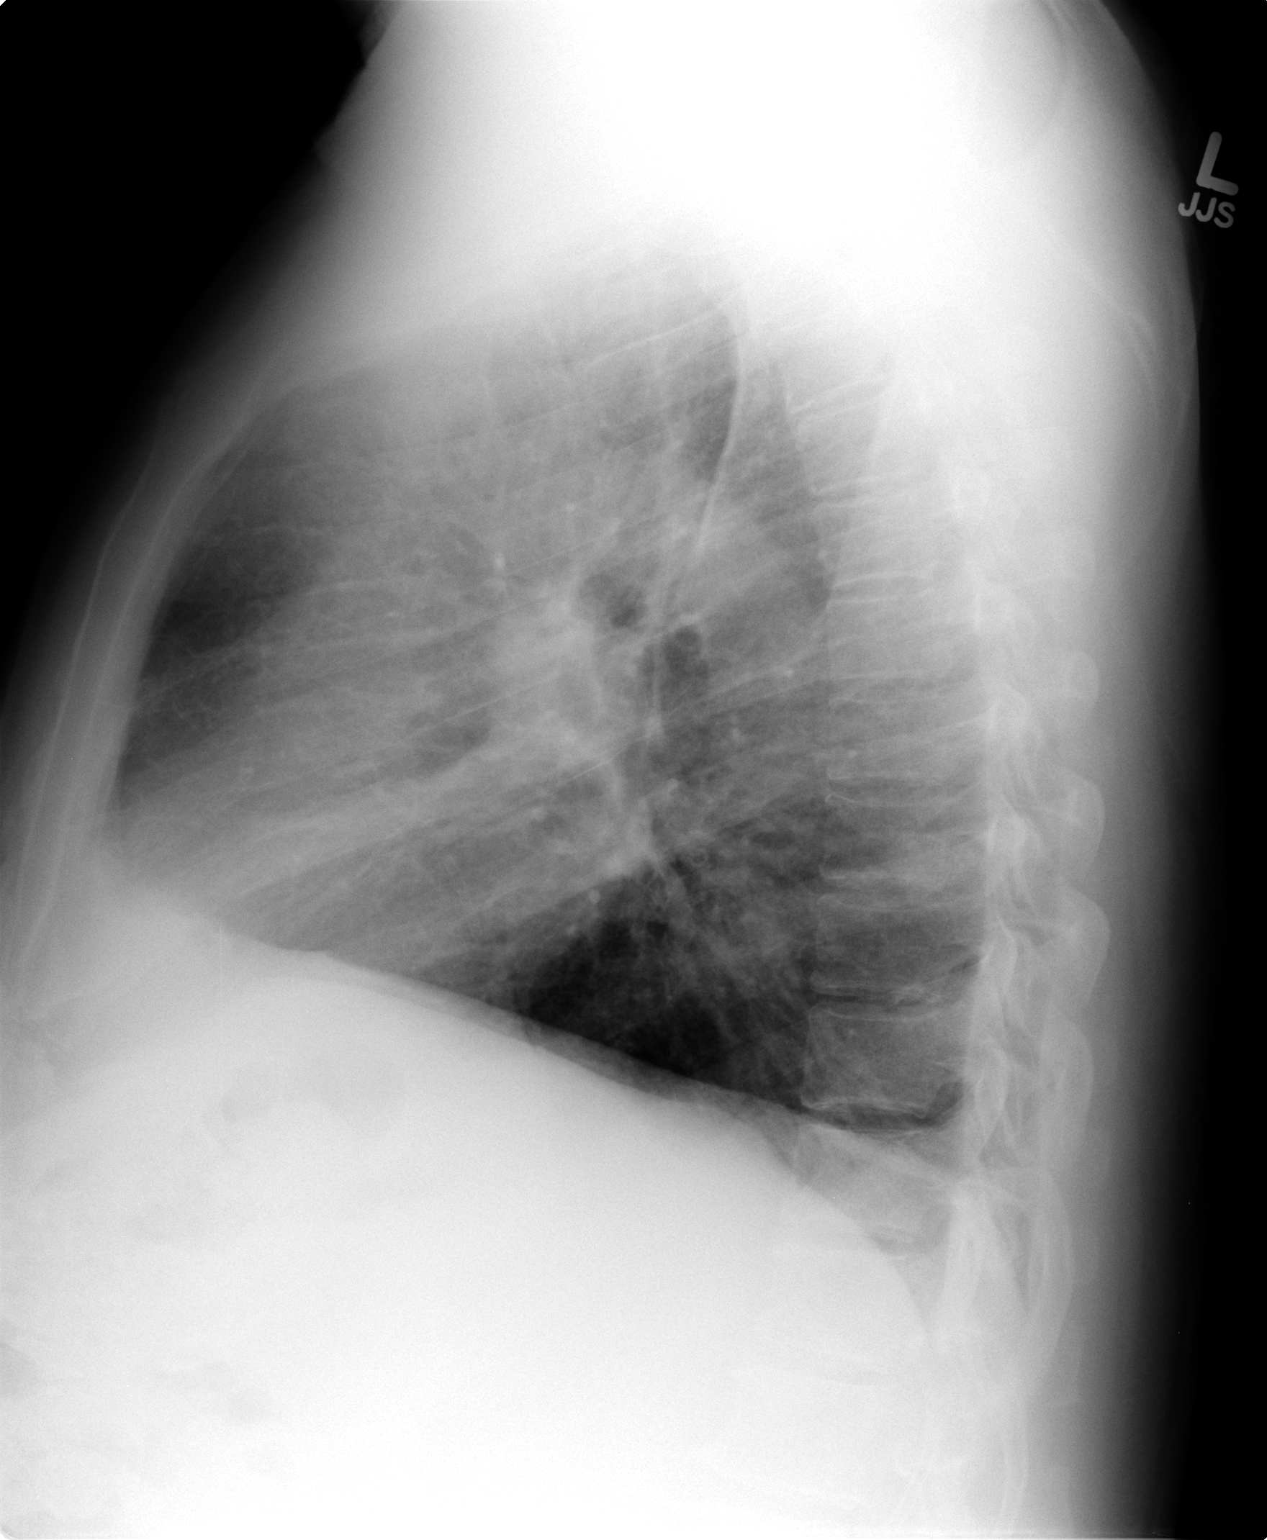

[2 of 2 positions shown; findings below may reference images not displayed]

FINDINGS: Heart size appears normal.

There is no pleural effusion or pulmonary edema.

No airspace consolidation identified.

Review of the visualized osseous structures is unremarkable.
IMPRESSION: 1.  No active cardiopulmonary abnormalities.

## 2012-01-11 ENCOUNTER — Other Ambulatory Visit: Payer: Self-pay | Admitting: Cardiology

## 2012-01-13 ENCOUNTER — Ambulatory Visit (INDEPENDENT_AMBULATORY_CARE_PROVIDER_SITE_OTHER): Payer: Medicare Other | Admitting: Cardiology

## 2012-01-13 ENCOUNTER — Telehealth: Payer: Self-pay | Admitting: Cardiology

## 2012-01-13 ENCOUNTER — Encounter: Payer: Self-pay | Admitting: Cardiology

## 2012-01-13 VITALS — BP 146/80 | HR 66 | Ht 68.0 in | Wt 230.0 lb

## 2012-01-13 DIAGNOSIS — E785 Hyperlipidemia, unspecified: Secondary | ICD-10-CM

## 2012-01-13 DIAGNOSIS — I2510 Atherosclerotic heart disease of native coronary artery without angina pectoris: Secondary | ICD-10-CM

## 2012-01-13 DIAGNOSIS — G4733 Obstructive sleep apnea (adult) (pediatric): Secondary | ICD-10-CM

## 2012-01-13 DIAGNOSIS — I10 Essential (primary) hypertension: Secondary | ICD-10-CM

## 2012-01-13 LAB — BASIC METABOLIC PANEL
BUN: 26 mg/dL — ABNORMAL HIGH (ref 6–23)
CO2: 27 mEq/L (ref 19–32)
Chloride: 106 mEq/L (ref 96–112)
Creatinine, Ser: 1.5 mg/dL (ref 0.4–1.5)
Glucose, Bld: 87 mg/dL (ref 70–99)

## 2012-01-13 MED ORDER — LISINOPRIL 20 MG PO TABS: 20.0000 mg | ORAL_TABLET | Freq: Every day | ORAL | Status: DC

## 2012-01-13 NOTE — Assessment & Plan Note (Signed)
No current symptoms.  Will be on DAPT until June, then will opt for ASA alone.

## 2012-01-13 NOTE — Assessment & Plan Note (Signed)
See report of Dr. Shelle Iron.  Will have him see him back to discuss options.

## 2012-01-13 NOTE — Assessment & Plan Note (Addendum)
Last LDL was really low.  HDL is quite low.  Might be candidate for CTEP trial, and will discuss with him.  Will consider reducing the overall level of statin with LDL of 39.  He also believes he is just taking Crestor, but both are listed on his med lists.  He is pretty sure he is just taking one, and I reviewed this with him in detail.  He will call us later today and let us know his medication status.

## 2012-01-13 NOTE — Telephone Encounter (Signed)
New Problem   Patient request return call from Nurse LB concerning medication instruction. He can be reached at hm# [phone]

## 2012-01-13 NOTE — Patient Instructions (Signed)
Your physician recommends that you have lab work today: [facility]  Your physician recommends that you continue on your current medications as directed. Please refer to the Current Medication list given to you today.  PLEASE CALL OUR OFFICE AND LET ME KNOW IF YOU ARE TAKING CRESTOR OR ATORVASTATIN.   Your physician recommends that you schedule a follow-up appointment in: 4 MONTHS  Please move-up your appointment with Dr Shelle Iron in Pulmonary to discuss your sleep study results.

## 2012-01-13 NOTE — Assessment & Plan Note (Signed)
Much improved

## 2012-01-13 NOTE — Telephone Encounter (Signed)
Error

## 2012-01-13 NOTE — Telephone Encounter (Signed)
I spoke with the pt and he is taking Crestor 40mg  and Atorvastatin 20mg .  At this time cost is not an issue with Crestor.  I instructed the pt to STOP Atorvastatin and continue Crestor.  Dr Riley Kill aware of this information.

## 2012-01-13 NOTE — Progress Notes (Signed)
HPI:  Patient is doing well.  No major complaints.  Sleep study was modestly abnormal, but not due to see Dr. Shelle Iron again until June.  We will see if we can get an earlier appointment.  Patient also thinks he is taking Crestor, not lipitor.  No complaints of leg aching.  He is to call us with his meds, but pretty sure he is only taking Crestor and did not switch.    Current Outpatient Prescriptions  Medication Sig Dispense Refill  . albuterol (PROAIR HFA) 108 (90 BASE) MCG/ACT inhaler Inhale 2 puffs into the lungs every 6 (six) hours as needed for wheezing.  1 Inhaler  6  . amLODipine (NORVASC) 5 MG tablet Take 1 tablet (5 mg total) by mouth daily.  30 tablet  6  . aspirin 81 MG tablet Take 81 mg by mouth daily.        Marland Kitchen atorvastatin (LIPITOR) 20 MG tablet Take 1 tablet (20 mg total) by mouth daily.  30 tablet  6  . budesonide-formoterol (SYMBICORT) 160-4.5 MCG/ACT inhaler Inhale 2 puffs into the lungs 2 (two) times daily.        . carvedilol (COREG) 12.5 MG tablet Take 1 and 1/2 tablet twice a day.  90 tablet  6  . CRESTOR 40 MG tablet TAKE ONE TABLET BY MOUTH AT BEDTIME  30 each  6  . diphenhydrAMINE (BENADRYL) 25 mg capsule Take 25 mg by mouth every 4 (four) hours as needed.       . Garlic 1000 MG CAPS Take 1 capsule by mouth daily.        Marland Kitchen lisinopril (PRINIVIL,ZESTRIL) 20 MG tablet TAKE ONE TABLET BY MOUTH EVERY DAY  30 tablet  2  . Omega-3 Fatty Acids (FISH OIL) 1000 MG CAPS Take 2 capsules by mouth daily.        Marland Kitchen omeprazole (PRILOSEC OTC) 20 MG tablet Take 20 mg by mouth daily.        . prasugrel (EFFIENT) 10 MG TABS Take 1 tablet (10 mg total) by mouth daily.  30 tablet  6  . tiotropium (SPIRIVA HANDIHALER) 18 MCG inhalation capsule Place 1 capsule (18 mcg total) into inhaler and inhale daily.  1 capsule  3    Allergies  Allergen Reactions  . Codeine     Past Medical History  Diagnosis Date  . COPD (chronic obstructive pulmonary disease)     ?  Marland Kitchen Paroxysmal atrial  fibrillation   . 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 Ladona Ridgel 6/10  . Syncope and collapse   . History of migraines   . GERD (gastroesophageal reflux disease)   . Dyspnea   . Hypertension   . Allergic rhinitis   . HLD (hyperlipidemia)   . CAD (coronary artery disease)     a. s/p NSTEMI 6/12: c/b VT/VF arrest req. defib and amio IV rx., pRCA occluded at cath and treated with BMS;  staged PCI of mCFX 90% on 04/28/11 with Promus DES;  residual CAD at cath 6/12: pLAD 20%, pCFX 40%, oOM1 70%, oRI 60-70%, EF 20-25%;   follow up Echo 6/12 with recovered EF:  EF 55-60%, mild LVH, mod LAE  . Heart attack 04/25/2011    Past Surgical History  Procedure Date  . Knee surgery 1971    Secondary to injury  . 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 Lewayne Bunting  . Heart stent  placement 04/25/2011    Family History  Problem Relation Age of Onset  . Hypertension Mother   . Coronary artery disease Mother   . Diabetes Mother   . Heart disease Father   . Rheum arthritis Father   . Breast cancer Paternal Aunt     History   Social History  . Marital Status: Widowed    Spouse Name: N/A    Number of Children: 2  . Years of Education: N/A   Occupational History  . 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 Smoking status: Former Smoker -- 2.0 packs/day for 50 years    Types: Cigarettes    Quit date: 11/24/1999  . Smokeless tobacco: Never Used  . Alcohol Use: Yes     Occasional beer drinker and had moonshine 6 months ago.  . Drug Use: No  . Sexually Active: Not on file   Other Topics Concern  . Not on file   Social History Narrative   His wife died last Sep 04, 2023- widowed.Lives in [place] with his son.60-pack-year smoker but quit 10 years ago negative.    ROS: Please see the HPI.  All other systems reviewed and  negative.  PHYSICAL EXAM:  BP 146/80  Pulse 66  Ht 5\' 8"  (1.727 m)  Wt 230 lb (104.327 kg)  BMI 34.97 kg/m2  General: Well developed, well nourished, in no acute distress. Head:  Normocephalic and atraumatic. Neck: no JVD Lungs: Clear to auscultation and percussion. Heart: Normal S1 and S2.  No murmur, rubs or gallops.  Abdomen:  Normal bowel sounds; soft; non tender; no organomegaly Pulses: Pulses normal in all 4 extremities. Extremities: No clubbing or cyanosis. No edema. Neurologic: Alert and oriented x 3.  EKG:  NSR.  WNL.    ASSESSMENT AND PLAN:

## 2012-01-19 ENCOUNTER — Other Ambulatory Visit: Payer: Self-pay

## 2012-01-19 DIAGNOSIS — I10 Essential (primary) hypertension: Secondary | ICD-10-CM

## 2012-01-19 DIAGNOSIS — I2510 Atherosclerotic heart disease of native coronary artery without angina pectoris: Secondary | ICD-10-CM

## 2012-02-02 ENCOUNTER — Other Ambulatory Visit: Payer: Medicare Other

## 2012-05-12 ENCOUNTER — Ambulatory Visit: Payer: Medicare Other | Admitting: Pulmonary Disease

## 2012-05-16 ENCOUNTER — Encounter: Payer: Self-pay | Admitting: Cardiology

## 2012-05-16 ENCOUNTER — Ambulatory Visit (INDEPENDENT_AMBULATORY_CARE_PROVIDER_SITE_OTHER): Payer: Medicare Other | Admitting: Cardiology

## 2012-05-16 VITALS — BP 150/90 | HR 58 | Ht 68.0 in | Wt 227.0 lb

## 2012-05-16 DIAGNOSIS — I10 Essential (primary) hypertension: Secondary | ICD-10-CM

## 2012-05-16 DIAGNOSIS — I2510 Atherosclerotic heart disease of native coronary artery without angina pectoris: Secondary | ICD-10-CM

## 2012-05-16 DIAGNOSIS — I4891 Unspecified atrial fibrillation: Secondary | ICD-10-CM

## 2012-05-16 DIAGNOSIS — E785 Hyperlipidemia, unspecified: Secondary | ICD-10-CM

## 2012-05-16 LAB — HEPATIC FUNCTION PANEL
AST: 17 U/L (ref 0–37)
Alkaline Phosphatase: 64 U/L (ref 39–117)
Bilirubin, Direct: 0.1 mg/dL (ref 0.0–0.3)
Total Bilirubin: 0.8 mg/dL (ref 0.3–1.2)

## 2012-05-16 LAB — BASIC METABOLIC PANEL
BUN: 18 mg/dL (ref 6–23)
CO2: 29 mEq/L (ref 19–32)
Calcium: 8.8 mg/dL (ref 8.4–10.5)
Creatinine, Ser: 1.5 mg/dL (ref 0.4–1.5)
Glucose, Bld: 88 mg/dL (ref 70–99)

## 2012-05-16 LAB — LIPID PANEL: Total CHOL/HDL Ratio: 1

## 2012-05-16 MED ORDER — AMLODIPINE BESYLATE 5 MG PO TABS: 7.5000 mg | ORAL_TABLET | Freq: Every day | ORAL | Status: DC

## 2012-05-16 NOTE — Patient Instructions (Addendum)
Your physician has recommended you make the following change in your medication: STOP Effient, INCREASE Amlodipine 5mg  take one and one-half tablet by mouth daily, DO NOT STOP ASPIRIN  Your physician recommends that you schedule a follow-up appointment in: 6 WEEKS with Dr Riley Kill  Your physician recommends that you have lab work today: LIPID, LIVER and BMP

## 2012-05-16 NOTE — Assessment & Plan Note (Signed)
His blood pressure is currently elevated, but I am reluctant to increase his carvedilol. He's been on inhalers because of the potential for COPD. As such we will increase his amlodipine 7.5 mg or one half tablets daily. He'll return to see me in followup in about a month.

## 2012-05-16 NOTE — Assessment & Plan Note (Signed)
The patient had bare-metal stent placed in his right coronary artery daily. He is a drug-eluting stent placed in the circumflex coronary artery electively. He has been on an ADP inhibitor for over a year. Based upon the guidelines, we will discontinue this at the present time. He will remain a single aspirin daily. He is currently asymptomatic.

## 2012-05-16 NOTE — Assessment & Plan Note (Signed)
He was at target on his Crestor 40 mg.  We will recheck his lipid profile, there is a potential that we could reduce his dosing.

## 2012-05-16 NOTE — Progress Notes (Signed)
HPI:  Patient returns for followup visit. He is doing quite well. He is no major complaints. He would like to get off of Effient at the present time.  He was not checking his blood pressures are regular basis, and his blood pressure is elevated mildly today  Current Outpatient Prescriptions  Medication Sig Dispense Refill  . albuterol (PROAIR HFA) 108 (90 BASE) MCG/ACT inhaler Inhale 2 puffs into the lungs every 6 (six) hours as needed for wheezing.  1 Inhaler  6  . amLODipine (NORVASC) 5 MG tablet Take 1 tablet (5 mg total) by mouth daily.  30 tablet  6  . aspirin 81 MG tablet Take 81 mg by mouth daily.        . budesonide-formoterol (SYMBICORT) 160-4.5 MCG/ACT inhaler Inhale 2 puffs into the lungs 2 (two) times daily.        . carvedilol (COREG) 12.5 MG tablet Take 1 and 1/2 tablet twice a day.  90 tablet  6  . CRESTOR 40 MG tablet TAKE ONE TABLET BY MOUTH AT BEDTIME  30 each  6  . diphenhydrAMINE (BENADRYL) 25 mg capsule Take 25 mg by mouth every 4 (four) hours as needed.       . Garlic 1000 MG CAPS Take 1 capsule by mouth daily.        Marland Kitchen lisinopril (PRINIVIL,ZESTRIL) 20 MG tablet Take 1 tablet (20 mg total) by mouth daily.  90 tablet  3  . Omega-3 Fatty Acids (FISH OIL) 1000 MG CAPS Take 2 capsules by mouth daily.        Marland Kitchen omeprazole (PRILOSEC OTC) 20 MG tablet Take 20 mg by mouth daily.        . prasugrel (EFFIENT) 10 MG TABS Take 1 tablet (10 mg total) by mouth daily.  30 tablet  6  . STUDY MEDICATION Accelerate study        Allergies  Allergen Reactions  . Codeine     Past Medical History  Diagnosis Date  . COPD (chronic obstructive pulmonary disease)     ?  Marland Kitchen Paroxysmal atrial fibrillation   . 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 Ladona Ridgel 6/10  . Syncope and collapse   . History of migraines   . GERD (gastroesophageal reflux disease)   . Dyspnea   . Hypertension   . Allergic rhinitis   . HLD (hyperlipidemia)   . CAD (coronary artery  disease)     a. s/p NSTEMI 6/12: c/b VT/VF arrest req. defib and amio IV rx., pRCA occluded at cath and treated with BMS;  staged PCI of mCFX 90% on 04/28/11 with Promus DES;  residual CAD at cath 6/12: pLAD 20%, pCFX 40%, oOM1 70%, oRI 60-70%, EF 20-25%;   follow up Echo 6/12 with recovered EF:  EF 55-60%, mild LVH, mod LAE  . Heart attack 04/25/2011    Past Surgical History  Procedure Date  . Knee surgery 1971    Secondary to injury  . 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 Lewayne Bunting  . Heart stent placement 04/25/2011    Family History  Problem Relation Age of Onset  . Hypertension Mother   . Coronary artery disease Mother   . Diabetes Mother   . Heart disease Father   . Rheum arthritis Father   . Breast cancer Paternal Aunt     History   Social History  . Marital Status: Widowed  Spouse Name: N/A    Number of Children: 2  . Years of Education: N/A   Occupational History  . 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 Smoking status: Former Smoker -- 2.0 packs/day for 50 years    Types: Cigarettes    Quit date: 11/24/1999  . Smokeless tobacco: Never Used  . Alcohol Use: Yes     Occasional beer drinker and had moonshine 6 months ago.  . Drug Use: No  . Sexually Active: Not on file   Other Topics Concern  . Not on file   Social History Narrative   His wife died last 03-Sep-2023- widowed.Lives in [place] with his son.60-pack-year smoker but quit 10 years ago negative.    ROS: Please see the HPI.  All other systems reviewed and negative.  PHYSICAL EXAM:  BP 150/90  Pulse 58  Ht 5\' 8"  (1.727 m)  Wt 227 lb (102.967 kg)  BMI 34.52 kg/m2  General: Well developed, well nourished, in no acute distress. Head:  Normocephalic and atraumatic. Neck: no JVD Lungs: Clear to auscultation and percussion.  I do not appreciate any wheezing.     Heart: Normal S1 and S2.  No murmur, rubs or gallops.  Abdomen:  Normal bowel sounds; soft; non tender; no organomegaly Pulses: Pulses normal in all 4 extremities. Extremities: No clubbing or cyanosis. No edema. Neurologic: Alert and oriented x 3.  EKG:  NSR.  WNL.  ASSESSMENT AND PLAN:

## 2012-05-17 ENCOUNTER — Telehealth: Payer: Self-pay | Admitting: Cardiology

## 2012-05-17 NOTE — Telephone Encounter (Signed)
Please return call to patient son Sharl Ma [phone] regarding patient medication, patient seems a little confused.

## 2012-05-17 NOTE — Telephone Encounter (Signed)
I spoke with the pt's son and made him aware of medication instructions from 05/16/12 office visit.

## 2012-05-19 ENCOUNTER — Other Ambulatory Visit: Payer: Self-pay

## 2012-05-19 ENCOUNTER — Other Ambulatory Visit (INDEPENDENT_AMBULATORY_CARE_PROVIDER_SITE_OTHER): Payer: Medicare Other

## 2012-05-19 DIAGNOSIS — E7800 Pure hypercholesterolemia, unspecified: Secondary | ICD-10-CM

## 2012-05-19 LAB — LIPID PANEL
HDL: 89.2 mg/dL (ref >39.00)
Total CHOL/HDL Ratio: 1

## 2012-05-24 ENCOUNTER — Telehealth: Payer: Self-pay | Admitting: Cardiology

## 2012-05-24 NOTE — Telephone Encounter (Signed)
They would like lab results

## 2012-05-24 NOTE — Telephone Encounter (Signed)
I spoke with the pt's son and made him aware of lab results. The pt will decrease Crestor to 40mg  take one-half tablet by mouth daily and have follow-up labs at August appointment.

## 2012-06-10 ENCOUNTER — Other Ambulatory Visit: Payer: Self-pay | Admitting: Cardiology

## 2012-06-23 ENCOUNTER — Ambulatory Visit (INDEPENDENT_AMBULATORY_CARE_PROVIDER_SITE_OTHER): Payer: Medicare Other | Admitting: Pulmonary Disease

## 2012-06-23 ENCOUNTER — Encounter: Payer: Self-pay | Admitting: Pulmonary Disease

## 2012-06-23 VITALS — BP 114/82 | HR 73 | Temp 98.4°F | Ht 68.0 in | Wt 224.6 lb

## 2012-06-23 DIAGNOSIS — J449 Chronic obstructive pulmonary disease, unspecified: Secondary | ICD-10-CM

## 2012-06-23 MED ORDER — BUDESONIDE-FORMOTEROL FUMARATE 160-4.5 MCG/ACT IN AERO: 2.0000 | INHALATION_SPRAY | Freq: Two times a day (BID) | RESPIRATORY_TRACT | Status: DC

## 2012-06-23 MED ORDER — ALBUTEROL SULFATE HFA 108 (90 BASE) MCG/ACT IN AERS: 2.0000 | INHALATION_SPRAY | Freq: Four times a day (QID) | RESPIRATORY_TRACT | Status: DC | PRN

## 2012-06-23 NOTE — Progress Notes (Signed)
  Subjective:    Patient ID: [name], male    DOB: 1946-04-25, 66 y.o.   MRN: [id number]  HPI The patient comes in today for followup of his known moderate COPD.  He has been compliant with his bronchodilator regimen, and feels that his exertional tolerance is stable.  He has not had an acute exacerbation since the last visit.  He denies any chest congestion or purulent mucus.   Review of Systems  Constitutional: Negative for fever and unexpected weight change.  HENT: Positive for congestion. Negative for ear pain, nosebleeds, sore throat, rhinorrhea, sneezing, trouble swallowing, dental problem, postnasal drip and sinus pressure.   Eyes: Negative for redness and itching.  Respiratory: Positive for wheezing. Negative for cough, chest tightness and shortness of breath.   Cardiovascular: Negative for palpitations and leg swelling.  Gastrointestinal: Negative for nausea and vomiting.  Genitourinary: Negative for dysuria.  Musculoskeletal: Negative for joint swelling.  Skin: Negative for rash.  Neurological: Positive for numbness. Negative for headaches.  Hematological: Does not bruise/bleed easily.  Psychiatric/Behavioral: Negative for dysphoric mood. The patient is not nervous/anxious.   All other systems reviewed and are negative.       Objective:   Physical Exam Well-developed male in no acute distress Nose without purulence or discharge noted Chest with mild decrease in breath sounds, no wheezing Cardiac exam with regular rate and rhythm Lower extremities without significant edema, no cyanosis Alert and oriented, moves all 4 extremities.       Assessment & Plan:

## 2012-06-23 NOTE — Assessment & Plan Note (Signed)
The pt feels that he is stable on his current BD regimen, and has not had a pulmonary infection or acute exacerbation since the last visit.  He feels his exertional tolerance is at baseline. I have asked him to continue with his same inhalers, and to work on weight loss and conditioning.

## 2012-06-23 NOTE — Patient Instructions (Addendum)
No change in medications Work on weight loss and your conditioning. followup with me in 6mos.

## 2012-06-25 ENCOUNTER — Encounter (HOSPITAL_COMMUNITY): Payer: Self-pay | Admitting: *Deleted

## 2012-06-25 ENCOUNTER — Emergency Department (HOSPITAL_COMMUNITY): Payer: Medicare Other

## 2012-06-25 ENCOUNTER — Emergency Department (HOSPITAL_COMMUNITY)
Admission: EM | Admit: 2012-06-25 | Discharge: 2012-06-25 | Disposition: A | Discharge status: Home / Self Care | Payer: Medicare Other | Attending: Emergency Medicine | Admitting: Emergency Medicine

## 2012-06-25 DIAGNOSIS — I10 Essential (primary) hypertension: Secondary | ICD-10-CM | POA: Insufficient documentation

## 2012-06-25 DIAGNOSIS — Z8249 Family history of ischemic heart disease and other diseases of the circulatory system: Secondary | ICD-10-CM | POA: Insufficient documentation

## 2012-06-25 DIAGNOSIS — Z833 Family history of diabetes mellitus: Secondary | ICD-10-CM | POA: Insufficient documentation

## 2012-06-25 DIAGNOSIS — Z87891 Personal history of nicotine dependence: Secondary | ICD-10-CM | POA: Insufficient documentation

## 2012-06-25 DIAGNOSIS — I2510 Atherosclerotic heart disease of native coronary artery without angina pectoris: Secondary | ICD-10-CM | POA: Insufficient documentation

## 2012-06-25 DIAGNOSIS — E785 Hyperlipidemia, unspecified: Secondary | ICD-10-CM | POA: Insufficient documentation

## 2012-06-25 DIAGNOSIS — J309 Allergic rhinitis, unspecified: Secondary | ICD-10-CM | POA: Insufficient documentation

## 2012-06-25 DIAGNOSIS — S46909A Unspecified injury of unspecified muscle, fascia and tendon at shoulder and upper arm level, unspecified arm, initial encounter: Secondary | ICD-10-CM | POA: Insufficient documentation

## 2012-06-25 DIAGNOSIS — X500XXA Overexertion from strenuous movement or load, initial encounter: Secondary | ICD-10-CM | POA: Insufficient documentation

## 2012-06-25 DIAGNOSIS — S4980XA Other specified injuries of shoulder and upper arm, unspecified arm, initial encounter: Secondary | ICD-10-CM | POA: Insufficient documentation

## 2012-06-25 DIAGNOSIS — M25519 Pain in unspecified shoulder: Secondary | ICD-10-CM

## 2012-06-25 DIAGNOSIS — J449 Chronic obstructive pulmonary disease, unspecified: Secondary | ICD-10-CM | POA: Insufficient documentation

## 2012-06-25 DIAGNOSIS — I4891 Unspecified atrial fibrillation: Secondary | ICD-10-CM | POA: Insufficient documentation

## 2012-06-25 DIAGNOSIS — K219 Gastro-esophageal reflux disease without esophagitis: Secondary | ICD-10-CM | POA: Insufficient documentation

## 2012-06-25 DIAGNOSIS — I252 Old myocardial infarction: Secondary | ICD-10-CM | POA: Insufficient documentation

## 2012-06-25 DIAGNOSIS — J4489 Other specified chronic obstructive pulmonary disease: Secondary | ICD-10-CM | POA: Insufficient documentation

## 2012-06-25 DIAGNOSIS — S46009A Unspecified injury of muscle(s) and tendon(s) of the rotator cuff of unspecified shoulder, initial encounter: Secondary | ICD-10-CM

## 2012-06-25 DIAGNOSIS — Z803 Family history of malignant neoplasm of breast: Secondary | ICD-10-CM | POA: Insufficient documentation

## 2012-06-25 MED ORDER — IBUPROFEN 600 MG PO TABS: 600.0000 mg | ORAL_TABLET | Freq: Four times a day (QID) | ORAL | Status: AC | PRN

## 2012-06-25 MED ORDER — HYDROCODONE-ACETAMINOPHEN 5-500 MG PO TABS: 1.0000 | ORAL_TABLET | Freq: Four times a day (QID) | ORAL | Status: AC | PRN

## 2012-06-25 MED ORDER — IBUPROFEN 200 MG PO TABS
600.0000 mg | ORAL_TABLET | Freq: Once | ORAL | Status: AC
  Administered 2012-06-25: 600 mg via ORAL
  Filled 2012-06-25: qty 3

## 2012-06-25 NOTE — ED Notes (Signed)
Positive Pulse Movement and Sensation to the Right upper Extremity.

## 2012-06-25 NOTE — ED Notes (Signed)
Patient taken to Xray  

## 2012-06-25 NOTE — ED Provider Notes (Signed)
Medical screening examination/treatment/procedure(s) were performed by non-physician practitioner and as supervising physician I was immediately available for consultation/collaboration.  Richardean Canal, MD 06/25/12 1101

## 2012-06-25 NOTE — ED Notes (Signed)
Reports doing heavy lifting yesterday and has been having right shoulder pain since. No acute distress noted at triage.

## 2012-06-25 NOTE — ED Notes (Signed)
Patient discharged with instructions and  Teach Back method used patient verbalized an understanding.

## 2012-06-25 NOTE — ED Notes (Signed)
Patient is back from xray

## 2012-06-25 NOTE — ED Provider Notes (Signed)
History     CSN: [id number]  Arrival date & time 06/25/12  0941   First MD Initiated Contact with Patient 06/25/12 1004      Chief Complaint  Patient presents with  . Shoulder Pain    (Consider location/radiation/quality/duration/timing/severity/associated sxs/prior treatment) HPI Comments: 66 y/o male presents with right shoulder pain s/p lifting a Ladona Ridgel (gardening thing) off of a truck yesterday with his son. States he heard a pop and has had pain since. Describes pain as sharp rated 10/10. He has not tried any alleviating factors. Moving his arm makes the pain worse. Denies ever hurting his shoulder in the past. Denies any numbness or tingling, neck pain, back pain, elbow pain.  Patient is a 66 y.o. male presenting with shoulder pain. The history is provided by the patient.  Shoulder Pain Associated symptoms include arthralgias. Pertinent negatives include no neck pain or numbness.    Past Medical History  Diagnosis Date  . COPD (chronic obstructive pulmonary disease)     ?  Marland Kitchen Paroxysmal atrial fibrillation   . 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 Ladona Ridgel 6/10  . Syncope and collapse   . History of migraines   . GERD (gastroesophageal reflux disease)   . Dyspnea   . Hypertension   . Allergic rhinitis   . HLD (hyperlipidemia)   . CAD (coronary artery disease)     a. s/p NSTEMI 6/12: c/b VT/VF arrest req. defib and amio IV rx., pRCA occluded at cath and treated with BMS;  staged PCI of mCFX 90% on 04/28/11 with Promus DES;  residual CAD at cath 6/12: pLAD 20%, pCFX 40%, oOM1 70%, oRI 60-70%, EF 20-25%;   follow up Echo 6/12 with recovered EF:  EF 55-60%, mild LVH, mod LAE  . Heart attack 04/25/2011    Past Surgical History  Procedure Date  . Knee surgery 1971    Secondary to injury  . 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 Lewayne Bunting  . Heart stent placement  04/25/2011    Family History  Problem Relation Age of Onset  . Hypertension Mother   . Coronary artery disease Mother   . Diabetes Mother   . Heart disease Father   . Rheum arthritis Father   . Breast cancer Paternal Aunt     History  Substance Use Topics  . Smoking status: Former Smoker -- 2.0 packs/day for 50 years    Types: Cigarettes    Quit date: 11/24/1999  . Smokeless tobacco: Never Used  . Alcohol Use: Yes     Occasional beer drinker and had moonshine 6 months ago.      Review of Systems  HENT: Negative for neck pain.   Musculoskeletal: Positive for arthralgias. Negative for back pain.       Right shoulder pain  Neurological: Negative for numbness.    Allergies  Codeine  Home Medications   Current Outpatient Rx  Name Route Sig Dispense Refill  . ALBUTEROL SULFATE HFA 108 (90 BASE) MCG/ACT IN AERS Inhalation Inhale 2 puffs into the lungs every 6 (six) hours as needed for wheezing. 1 Inhaler 11  . AMLODIPINE BESYLATE 5 MG PO TABS Oral Take 1.5 tablets (7.5 mg total) by mouth daily. 45 tablet 6  . ASPIRIN 81 MG PO TABS Oral Take 81 mg by mouth daily.      . BUDESONIDE-FORMOTEROL FUMARATE 160-4.5 MCG/ACT IN AERO Inhalation Inhale 2 puffs  into the lungs 2 (two) times daily. 1 Inhaler 11  . CARVEDILOL 12.5 MG PO TABS Oral Take 18.75 mg by mouth 2 (two) times daily with a meal.    . DIPHENHYDRAMINE HCL 25 MG PO CAPS Oral Take 25 mg by mouth every 4 (four) hours as needed. For sleeplessness    . GARLIC 1000 MG PO CAPS Oral Take 1,000 mg by mouth 2 (two) times daily.     Marland Kitchen LISINOPRIL 20 MG PO TABS Oral Take 1 tablet (20 mg total) by mouth daily. 90 tablet 3  . FISH OIL 1000 MG PO CAPS Oral Take 1,000 mg by mouth 2 (two) times daily.     Marland Kitchen OMEPRAZOLE MAGNESIUM 20 MG PO TBEC Oral Take 20 mg by mouth every evening.     Marland Kitchen ROSUVASTATIN CALCIUM 40 MG PO TABS Oral Take 20 mg by mouth every evening.     . STUDY MEDICATION  Accelerate study      BP 133/88  Pulse 64  Temp  97.9 F (36.6 C) (Oral)  Resp 20  SpO2 98%  Physical Exam  Nursing note and vitals reviewed. Constitutional: He is oriented to person, place, and time. He appears well-developed and well-nourished. No distress.  HENT:  Head: Atraumatic.  Eyes: Conjunctivae are normal.  Neck: Normal range of motion. Neck supple.  Cardiovascular: Normal rate, regular rhythm and normal heart sounds.   Pulmonary/Chest: Effort normal and breath sounds normal.  Musculoskeletal:       Right shoulder: He exhibits decreased range of motion, pain and decreased strength. He exhibits no bony tenderness, no swelling, no deformity and normal pulse.       Slow but full active ROM with right shoulder flexion. Decreased active ROM in all other directions. Decreased active ROM with abduction to 30 degrees due to pain. Positive empty can sign. Tenderness to palpation of insertion of biceps tendon at shoulder. No evidence of biceps tendon rupture.  Neurological: He is alert and oriented to person, place, and time. No sensory deficit.  Skin: Skin is warm and dry. No ecchymosis noted.  Psychiatric: He has a normal mood and affect. His behavior is normal.    ED Course  Procedures (including critical care time)  Labs Reviewed - No data to display Dg Shoulder Right  06/25/2012  *RADIOLOGY REPORT*  Clinical Data: Right shoulder pain/injury  RIGHT SHOULDER - 2+ VIEW  Comparison: None.  Findings: No fracture or dislocation is seen.  Moderate degenerative changes of the right glenohumeral and acromioclavicular joints.  The visualized soft tissues are unremarkable.  Visualized right lung is essentially clear.  IMPRESSION: No fracture or dislocation is seen.  Moderate degenerative changes.  Original Report Authenticated By: Charline Bills, M.D.     1. Shoulder pain   2. Rotator cuff injury       MDM  66 y/o male with right shoulder pain s/p lifting a heavy Ladona Ridgel off his truck yesterday. Xray normal. Exam consistent with  rotator cuff injury. Discharge with pain control, anti-inflammatory, and ortho follow up. Case discussed with Dr. Chaney Malling who agrees with plan of care.       Trevor Mace, PA-C 06/25/12 1101

## 2012-06-27 ENCOUNTER — Encounter: Payer: Self-pay | Admitting: Cardiology

## 2012-06-27 ENCOUNTER — Ambulatory Visit (INDEPENDENT_AMBULATORY_CARE_PROVIDER_SITE_OTHER): Payer: Medicare Other | Admitting: Cardiology

## 2012-06-27 VITALS — BP 135/75 | HR 57 | Ht 68.0 in | Wt 221.0 lb

## 2012-06-27 DIAGNOSIS — G4733 Obstructive sleep apnea (adult) (pediatric): Secondary | ICD-10-CM

## 2012-06-27 DIAGNOSIS — E785 Hyperlipidemia, unspecified: Secondary | ICD-10-CM

## 2012-06-27 DIAGNOSIS — I2510 Atherosclerotic heart disease of native coronary artery without angina pectoris: Secondary | ICD-10-CM

## 2012-06-27 DIAGNOSIS — I10 Essential (primary) hypertension: Secondary | ICD-10-CM

## 2012-06-27 MED ORDER — ROSUVASTATIN CALCIUM 10 MG PO TABS: 10.0000 mg | ORAL_TABLET | Freq: Every evening | ORAL | Status: DC

## 2012-06-27 NOTE — Assessment & Plan Note (Signed)
The patient is being followed by Dr. Shelle Iron for COPD and also for OSA.  He is doing well.,

## 2012-06-27 NOTE — Patient Instructions (Addendum)
Your physician has recommended you make the following change in your medication: DECREASE Crestor to 10mg  take one by mouth daily  Your physician wants you to follow-up in: 6 MONTHS with Dr Riley Kill. You will receive a reminder letter in the mail two months in advance. If you don't receive a letter, please call our office to schedule the follow-up appointment.

## 2012-06-27 NOTE — Progress Notes (Signed)
HPI:  The patient is seen back today in followup. From a cardiac standpoint he is doing well. He denies chest pain or shortness of breath, but is not been exercising on a regular basis like he should be.  Otherwise he is getting along well.   Current Outpatient Prescriptions  Medication Sig Dispense Refill  . albuterol (PROAIR HFA) 108 (90 BASE) MCG/ACT inhaler Inhale 2 puffs into the lungs every 6 (six) hours as needed for wheezing.  1 Inhaler  11  . amLODipine (NORVASC) 5 MG tablet Take 1.5 tablets (7.5 mg total) by mouth daily.  45 tablet  6  . aspirin 81 MG tablet Take 81 mg by mouth daily.        . budesonide-formoterol (SYMBICORT) 160-4.5 MCG/ACT inhaler Inhale 2 puffs into the lungs 2 (two) times daily.  1 Inhaler  11  . carvedilol (COREG) 12.5 MG tablet Take 18.75 mg by mouth 2 (two) times daily with a meal.      . diphenhydrAMINE (BENADRYL) 25 mg capsule Take 25 mg by mouth every 4 (four) hours as needed. For sleeplessness      . Garlic 1000 MG CAPS Take 1,000 mg by mouth 2 (two) times daily.       Marland Kitchen HYDROcodone-acetaminophen (VICODIN) 5-500 MG per tablet Take 1-2 tablets by mouth every 6 (six) hours as needed for pain.  15 tablet  0  . ibuprofen (ADVIL,MOTRIN) 600 MG tablet Take 1 tablet (600 mg total) by mouth every 6 (six) hours as needed for pain.  30 tablet  0  . lisinopril (PRINIVIL,ZESTRIL) 20 MG tablet Take 1 tablet (20 mg total) by mouth daily.  90 tablet  3  . Omega-3 Fatty Acids (FISH OIL) 1000 MG CAPS Take 1,000 mg by mouth 2 (two) times daily.       Marland Kitchen omeprazole (PRILOSEC OTC) 20 MG tablet Take 20 mg by mouth every evening.       . rosuvastatin (CRESTOR) 40 MG tablet Take 20 mg by mouth every evening.       . STUDY MEDICATION Accelerate study        Allergies  Allergen Reactions  . Codeine Other (See Comments)    hallucinations    Past Medical History  Diagnosis Date  . COPD (chronic obstructive pulmonary disease)     ?  Marland Kitchen Paroxysmal atrial fibrillation   .  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 Ladona Ridgel 6/10  . Syncope and collapse   . History of migraines   . GERD (gastroesophageal reflux disease)   . Dyspnea   . Hypertension   . Allergic rhinitis   . HLD (hyperlipidemia)   . CAD (coronary artery disease)     a. s/p NSTEMI 6/12: c/b VT/VF arrest req. defib and amio IV rx., pRCA occluded at cath and treated with BMS;  staged PCI of mCFX 90% on 04/28/11 with Promus DES;  residual CAD at cath 6/12: pLAD 20%, pCFX 40%, oOM1 70%, oRI 60-70%, EF 20-25%;   follow up Echo 6/12 with recovered EF:  EF 55-60%, mild LVH, mod LAE  . Heart attack 04/25/2011    Past Surgical History  Procedure Date  . Knee surgery 1971    Secondary to injury  . 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 Lewayne Bunting  . Heart stent placement 04/25/2011    Family History  Problem Relation Age of Onset  . Hypertension  Mother   . Coronary artery disease Mother   . Diabetes Mother   . Heart disease Father   . Rheum arthritis Father   . Breast cancer Paternal Aunt     History   Social History  . Marital Status: Widowed    Spouse Name: N/A    Number of Children: 2  . Years of Education: N/A   Occupational History  . 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 Smoking status: Former Smoker -- 2.0 packs/day for 50 years    Types: Cigarettes    Quit date: 11/24/1999  . Smokeless tobacco: Never Used  . Alcohol Use: Yes     Occasional beer drinker and had moonshine 6 months ago.  . Drug Use: No  . Sexually Active: Not on file   Other Topics Concern  . Not on file   Social History Narrative   His wife died last 09/14/2023- widowed.Lives in [place] with his son.60-pack-year smoker but quit 10 years ago negative.    ROS: Please see the HPI.  All other systems reviewed and negative.  PHYSICAL EXAM:  BP  135/75  Pulse 57  Ht 5\' 8"  (1.727 m)  Wt 221 lb (100.245 kg)  BMI 33.60 kg/m2  General: Well developed, well nourished, in no acute distress. Head:  Normocephalic and atraumatic. Neck: no JVD Lungs: Clear to auscultation and percussion. Heart: Normal S1 and S2.  No murmur, rubs or gallops.  Abdomen:  Normal bowel sounds; soft; non tender; no organomegaly.  Somewhat protuberant.   Pulses: Pulses normal in all 4 extremities. Extremities: No clubbing or cyanosis. No edema. Neurologic: Alert and oriented x 3.  EKG:    ASSESSMENT AND PLAN:

## 2012-06-27 NOTE — Assessment & Plan Note (Addendum)
Patient is doing extremely well. I've encouraged him to start exercising on a regular basis. Given the heat, we encouraged him to go home all at least 2 times a week do some regular exercise. His son says this would be reasonable as he needs to do as well.  He is now a single antiplatelet therapy.

## 2012-06-27 NOTE — Assessment & Plan Note (Signed)
At the present time we will go ahead and lower his Crestor 10 mg daily.

## 2012-06-27 NOTE — Assessment & Plan Note (Signed)
His blood pressure is clearly improved. We will continue him on the same current medications.

## 2012-10-13 ENCOUNTER — Telehealth: Payer: Self-pay | Admitting: Cardiology

## 2012-10-13 MED ORDER — CARVEDILOL 12.5 MG PO TABS: 18.7500 mg | ORAL_TABLET | Freq: Two times a day (BID) | ORAL | Status: DC

## 2012-10-13 NOTE — Telephone Encounter (Signed)
I spoke with [name] and the pt needs a refill on carvedilol.  Rx sent to pharmacy per his request.

## 2012-10-13 NOTE — Telephone Encounter (Signed)
Pt son Sharl Ma [phone] would like to speak with nurse regarding pt med authorization. He is upset as it has been a week since this request has been made.

## 2012-12-26 ENCOUNTER — Ambulatory Visit (INDEPENDENT_AMBULATORY_CARE_PROVIDER_SITE_OTHER): Payer: Medicare Other | Admitting: Pulmonary Disease

## 2012-12-26 ENCOUNTER — Encounter: Payer: Self-pay | Admitting: Pulmonary Disease

## 2012-12-26 VITALS — BP 122/82 | HR 61 | Temp 98.3°F | Ht 68.0 in | Wt 226.8 lb

## 2012-12-26 DIAGNOSIS — J449 Chronic obstructive pulmonary disease, unspecified: Secondary | ICD-10-CM

## 2012-12-26 NOTE — Progress Notes (Signed)
  Subjective:    Patient ID: [name], male    DOB: Apr 05, 1946, 67 y.o.   MRN: [id number]  HPI The patient comes in today for followup of his known COPD.  He was doing fairly well until a few weeks ago, when he began to develop increasing shortness of breath.  He denied any cough, chest congestion, or mucus production.  He also described audible wheezing that sounded classic for upper airway wheezing.  He was treated by his primary doctor with a medication of variable dosing, and it sounds like a prednisone taper.  He apparently needed one more round of this as well.  He feels that he is much improved, but is not back to his usual baseline.   Review of Systems  Constitutional: Negative for fever and unexpected weight change.  HENT: Positive for rhinorrhea. Negative for ear pain, nosebleeds, congestion, sore throat, sneezing, trouble swallowing, dental problem, postnasal drip and sinus pressure.   Eyes: Negative for redness and itching.  Respiratory: Positive for shortness of breath and wheezing. Negative for cough and chest tightness.   Cardiovascular: Positive for leg swelling ( feet swelling). Negative for palpitations.  Gastrointestinal: Negative for nausea and vomiting.  Genitourinary: Negative for dysuria.  Musculoskeletal: Positive for myalgias ( leg muscles and cramps---keeping patient awake at ngiht). Negative for joint swelling.  Skin: Negative for rash.  Neurological: Negative for headaches.  Hematological: Does not bruise/bleed easily.  Psychiatric/Behavioral: Negative for dysphoric mood. The patient is not nervous/anxious.        Objective:   Physical Exam Overweight male in no acute distress Nose with purulent discharge noted Neck without lymphadenopathy or thyromegaly Chest with totally clear breath sounds bilaterally, no wheezing Cardiac exam with regular rate and rhythm, 2/6 systolic murmur Lower extremities without significant edema, no cyanosis Alert and  oriented, moves all 4 extremities.       Assessment & Plan:

## 2012-12-26 NOTE — Assessment & Plan Note (Signed)
The patient has a history of moderate obstructive lung disease, and most recently has had an episode of increasing shortness of breath.  Some of his history is just that of a COPD exacerbation, but other parts are atypical.  I wonder how much of this is related to his known heart disease, and also whether he may be having upper airway dysfunction related to his ACE inhibitor.  I will add Spiriva to his symbicort as a trial, but if he does not see any difference, he may need further cardiac evaluation.  I would also consider changing his ACE inhibitor to an ARB, but we'll leave that to his primary care physician.

## 2012-12-26 NOTE — Patient Instructions (Addendum)
Stay on symbicort twice a day. Will add spiriva one inhalation each am for a 4 week trial.  Please call and give me some feedback with how things are going. Will recommend to your primary physician/cardiologist that you are taken off lisinopril if possible to see if this makes a difference to your symptoms. If doing well, followup with me in 6mos, but call if having issues.

## 2012-12-29 ENCOUNTER — Encounter: Payer: Self-pay | Admitting: Cardiology

## 2012-12-29 ENCOUNTER — Ambulatory Visit (INDEPENDENT_AMBULATORY_CARE_PROVIDER_SITE_OTHER): Payer: Medicare Other | Admitting: Cardiology

## 2012-12-29 VITALS — BP 122/84 | HR 77 | Ht 68.0 in | Wt 221.0 lb

## 2012-12-29 DIAGNOSIS — J449 Chronic obstructive pulmonary disease, unspecified: Secondary | ICD-10-CM

## 2012-12-29 DIAGNOSIS — I2510 Atherosclerotic heart disease of native coronary artery without angina pectoris: Secondary | ICD-10-CM

## 2012-12-29 DIAGNOSIS — I10 Essential (primary) hypertension: Secondary | ICD-10-CM

## 2012-12-29 DIAGNOSIS — I4891 Unspecified atrial fibrillation: Secondary | ICD-10-CM

## 2012-12-29 DIAGNOSIS — R0602 Shortness of breath: Secondary | ICD-10-CM

## 2012-12-29 DIAGNOSIS — E785 Hyperlipidemia, unspecified: Secondary | ICD-10-CM

## 2012-12-29 LAB — BASIC METABOLIC PANEL
BUN: 21 mg/dL (ref 6–23)
CO2: 26 mEq/L (ref 19–32)
Calcium: 8.9 mg/dL (ref 8.4–10.5)
Creatinine, Ser: 1.6 mg/dL — ABNORMAL HIGH (ref 0.4–1.5)
GFR: 46.75 mL/min — ABNORMAL LOW (ref >60.00)
Glucose, Bld: 90 mg/dL (ref 70–99)
Sodium: 138 mEq/L (ref 135–145)

## 2012-12-29 MED ORDER — METOPROLOL TARTRATE 50 MG PO TABS: 50.0000 mg | ORAL_TABLET | Freq: Two times a day (BID) | ORAL | Status: DC

## 2012-12-29 NOTE — Assessment & Plan Note (Signed)
Patient is on the accelerate trial.

## 2012-12-29 NOTE — Assessment & Plan Note (Signed)
See my notes

## 2012-12-29 NOTE — Assessment & Plan Note (Signed)
His blood pressures well controlled at this point. However, we are going to need to make some significant changes in his medications. First, we plan to reduce his carvedilol to 1 tablet twice daily, and then we will switch to metoprolol over the weekend. I will have him return to see me early next week at which time we will reassess his blood pressure and his dosing. This is done primarily because of the non-specific radius fracture of carvedilol. After this, we may well, as suggested by pulmonary medicine, switching back over to an angiotensin receptor blocker, but we will make these changes sequentially. We will continue to follow him closely well this is going on.

## 2012-12-29 NOTE — Assessment & Plan Note (Signed)
This may be multifactorial. I carefully reviewed pulmonary note. However, first we will switch his carvedilol to a more selective beta blocking agent. After this, we will make changes in his ACE inhibitor to an angiotensin receptor blocker area and this will be done sequentially. We will also check a BNP, and as well to bmet today. We might ultimately get an echocardiogram I will make that decision will see him back next week.

## 2012-12-29 NOTE — Progress Notes (Signed)
HPI:  This very nice gentleman returns today in a followup visit. He has upper respiratory infection currently, but otherwise been doing pretty well. However, he saw his primary care physician, and probably that time her shortness of breath. He seems to have some issue with a mild degree of wheezing.  He denies chest pain.  He saw the pulmonary service, and they questioned upper airway wheezing, and weather or not problems could be related to ACE inhibition.  He also complains of some pain and cramping in his foot.    Current Outpatient Prescriptions  Medication Sig Dispense Refill  . albuterol (PROAIR HFA) 108 (90 BASE) MCG/ACT inhaler Inhale 2 puffs into the lungs every 6 (six) hours as needed for wheezing.  1 Inhaler  11  . amLODipine (NORVASC) 5 MG tablet Take 1.5 tablets (7.5 mg total) by mouth daily.  45 tablet  6  . aspirin 81 MG tablet Take 81 mg by mouth daily.        . budesonide-formoterol (SYMBICORT) 160-4.5 MCG/ACT inhaler Inhale 2 puffs into the lungs 2 (two) times daily.  1 Inhaler  11  . carvedilol (COREG) 12.5 MG tablet Take 1.5 tablets (18.75 mg total) by mouth 2 (two) times daily with a meal.  270 tablet  3  . diphenhydrAMINE (BENADRYL) 25 mg capsule Take 25 mg by mouth every 4 (four) hours as needed. For sleeplessness      . Garlic 1000 MG CAPS Take 1,000 mg by mouth 2 (two) times daily.       Marland Kitchen lisinopril (PRINIVIL,ZESTRIL) 20 MG tablet Take 1 tablet (20 mg total) by mouth daily.  90 tablet  3  . Omega-3 Fatty Acids (FISH OIL) 1000 MG CAPS Take 1,000 mg by mouth 2 (two) times daily.       Marland Kitchen omeprazole (PRILOSEC OTC) 20 MG tablet Take 20 mg by mouth every evening.       . rosuvastatin (CRESTOR) 10 MG tablet Take 1 tablet (10 mg total) by mouth every evening.  30 tablet  11  . STUDY MEDICATION Accelerate study        Allergies  Allergen Reactions  . Codeine Other (See Comments)    hallucinations    Past Medical History  Diagnosis Date  . COPD (chronic obstructive  pulmonary disease)     ?  Marland Kitchen Paroxysmal atrial fibrillation   . 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 Ladona Ridgel 6/10  . Syncope and collapse   . History of migraines   . GERD (gastroesophageal reflux disease)   . Dyspnea   . Hypertension   . Allergic rhinitis   . HLD (hyperlipidemia)   . CAD (coronary artery disease)     a. s/p NSTEMI 6/12: c/b VT/VF arrest req. defib and amio IV rx., pRCA occluded at cath and treated with BMS;  staged PCI of mCFX 90% on 04/28/11 with Promus DES;  residual CAD at cath 6/12: pLAD 20%, pCFX 40%, oOM1 70%, oRI 60-70%, EF 20-25%;   follow up Echo 6/12 with recovered EF:  EF 55-60%, mild LVH, mod LAE  . Heart attack 04/25/2011    Past Surgical History  Procedure Date  . Knee surgery 1971    Secondary to injury  . 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 Lewayne Bunting  . Heart stent placement 04/25/2011    Family History  Problem Relation Age of Onset  . Hypertension Mother   .  Coronary artery disease Mother   . Diabetes Mother   . Heart disease Father   . Rheum arthritis Father   . Breast cancer Paternal Aunt     History   Social History  . Marital Status: Widowed    Spouse Name: N/A    Number of Children: 2  . Years of Education: N/A   Occupational History  . 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 Smoking status: Former Smoker -- 2.0 packs/day for 50 years    Types: Cigarettes    Quit date: 11/24/1999  . Smokeless tobacco: Never Used  . Alcohol Use: Yes     Comment: Occasional beer drinker and had moonshine 6 months ago.  . Drug Use: No  . Sexually Active: Not on file   Other Topics Concern  . Not on file   Social History Narrative   His wife died last Aug 24, 2023- widowed.Lives in [place] with his son.60-pack-year smoker but quit 10 years ago negative.    ROS: Please see  the HPI.  All other systems reviewed and negative.  PHYSICAL EXAM:  BP 122/84  Pulse 77  Ht 5\' 8"  (1.727 m)  Wt 221 lb (100.245 kg)  BMI 33.60 kg/m2  SpO2 97%  General: Well developed, well nourished, in no acute distress. Head:  Normocephalic and atraumatic. Neck: no JVD Lungs: Clear to auscultation and percussion.  Some prolonged expiration.   Heart: Normal S1 and S2.  No murmur, rubs or gallops.  Abdomen:  Normal bowel sounds; soft; non tender; no organomegaly Pulses: Pulses normal in all 4 extremities.  He and I both tested DP and PT in the RLE.   Extremities: No clubbing or cyanosis. No edema. Neurologic: Alert and oriented x 3.  EKG:  NSR.   PVCs.    ASSESSMENT AND PLAN:

## 2012-12-29 NOTE — Assessment & Plan Note (Signed)
No chest pain

## 2012-12-29 NOTE — Patient Instructions (Addendum)
Your physician has recommended you make the following change in your medication: DECREASE Carvedilol to 12.5mg  take one by mouth twice a day on Sunday and then discontinue medication, On Monday 01/02/13 START Metoprolol Tartrate 50mg  take one by mouth twice a day  Your physician recommends that you schedule a follow-up appointment in: 01/03/13 at 2:00  Your physician recommends that you have lab work today: BMP and BNP

## 2013-01-03 ENCOUNTER — Ambulatory Visit (INDEPENDENT_AMBULATORY_CARE_PROVIDER_SITE_OTHER): Payer: Medicare Other | Admitting: Cardiology

## 2013-01-03 ENCOUNTER — Encounter: Payer: Self-pay | Admitting: Cardiology

## 2013-01-03 VITALS — BP 133/71 | HR 65 | Ht 68.0 in | Wt 233.0 lb

## 2013-01-03 DIAGNOSIS — J449 Chronic obstructive pulmonary disease, unspecified: Secondary | ICD-10-CM

## 2013-01-03 DIAGNOSIS — I10 Essential (primary) hypertension: Secondary | ICD-10-CM

## 2013-01-03 NOTE — Progress Notes (Signed)
HPI:  /Returns in followup. He has had some wheezing, and we were thinking that perhaps the carvedilol was making it somewhat worse. As a result we switched him over to metoprolol. His transition is been relatively smooth and is not really have much in the way of problems. He was brought back in today for blood pressure check  Current Outpatient Prescriptions  Medication Sig Dispense Refill  . albuterol (PROAIR HFA) 108 (90 BASE) MCG/ACT inhaler Inhale 2 puffs into the lungs every 6 (six) hours as needed for wheezing.  1 Inhaler  11  . amLODipine (NORVASC) 5 MG tablet Take 1.5 tablets (7.5 mg total) by mouth daily.  45 tablet  6  . aspirin 81 MG tablet Take 81 mg by mouth daily.        . budesonide-formoterol (SYMBICORT) 160-4.5 MCG/ACT inhaler Inhale 2 puffs into the lungs 2 (two) times daily.  1 Inhaler  11  . diphenhydrAMINE (BENADRYL) 25 mg capsule Take 25 mg by mouth every 4 (four) hours as needed. For sleeplessness      . Garlic 1000 MG CAPS Take 1,000 mg by mouth 2 (two) times daily.       Marland Kitchen lisinopril (PRINIVIL,ZESTRIL) 20 MG tablet Take 1 tablet (20 mg total) by mouth daily.  90 tablet  3  . metoprolol (LOPRESSOR) 50 MG tablet Take 1 tablet (50 mg total) by mouth 2 (two) times daily.  60 tablet  3  . Omega-3 Fatty Acids (FISH OIL) 1000 MG CAPS Take 1,000 mg by mouth 2 (two) times daily.       Marland Kitchen omeprazole (PRILOSEC OTC) 20 MG tablet Take 20 mg by mouth every evening.       . rosuvastatin (CRESTOR) 10 MG tablet Take 1 tablet (10 mg total) by mouth every evening.  30 tablet  11  . STUDY MEDICATION Accelerate study       No current facility-administered medications for this visit.    Allergies  Allergen Reactions  . Codeine Other (See Comments)    hallucinations    Past Medical History  Diagnosis Date  . COPD (chronic obstructive pulmonary disease)     ?  Marland Kitchen Paroxysmal atrial fibrillation   . 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 Ladona Ridgel 6/10  .  Syncope and collapse   . History of migraines   . GERD (gastroesophageal reflux disease)   . Dyspnea   . Hypertension   . Allergic rhinitis   . HLD (hyperlipidemia)   . CAD (coronary artery disease)     a. s/p NSTEMI 6/12: c/b VT/VF arrest req. defib and amio IV rx., pRCA occluded at cath and treated with BMS;  staged PCI of mCFX 90% on 04/28/11 with Promus DES;  residual CAD at cath 6/12: pLAD 20%, pCFX 40%, oOM1 70%, oRI 60-70%, EF 20-25%;   follow up Echo 6/12 with recovered EF:  EF 55-60%, mild LVH, mod LAE  . Heart attack 04/25/2011    Past Surgical History  Procedure Laterality Date  . Knee surgery  1971    Secondary to injury  . Cardiac electrophysiology study and ablation  05/22/09    Radiofrequency catheter ablation of AV reentrant tachycardia; Dr. Lewayne Bunting  . Heart stent placement  04/25/2011    Family History  Problem Relation Age of Onset  . Hypertension Mother   . Coronary artery disease Mother   . Diabetes Mother   . Heart disease Father   . Rheum arthritis Father   .  Breast cancer Paternal Aunt     History   Social History  . Marital Status: Widowed    Spouse Name: N/A    Number of Children: 2  . Years of Education: N/A   Occupational History  . 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 Smoking status: Former Smoker -- 2.00 packs/day for 50 years    Types: Cigarettes    Quit date: 11/24/1999  . Smokeless tobacco: Never Used  . Alcohol Use: Yes     Comment: Occasional beer drinker and had moonshine 6 months ago.  . Drug Use: No  . Sexually Active: Not on file   Other Topics Concern  . Not on file   Social History Narrative   His wife died last August 31, 2023- widowed.   Lives in [place] with his son.   60-pack-year smoker but quit 10 years ago negative.    ROS: Please see the HPI.  All other systems reviewed and negative.  PHYSICAL EXAM:  BP 133/71  Pulse 65  Ht 5\' 8"  (1.727 m)  Wt 233 lb  (105.688 kg)  BMI 35.44 kg/m2  SpO2 95%  General: Well developed, well nourished, in no acute distress. Head:  Normocephalic and atraumatic. Neck: no JVD Lungs: Clear to auscultation and percussion. Heart: Normal S1 and S2.  No murmur, rubs or gallops. Extremities: No clubbing or cyanosis. No edema. Neurologic: Alert and oriented x 3.  EKG:  ASSESSMENT AND PLAN:

## 2013-01-03 NOTE — Assessment & Plan Note (Signed)
BP remains stable on new regimen.  Will see back again in about three weeks to reasess

## 2013-01-03 NOTE — Assessment & Plan Note (Signed)
Seems better since switch to metoprolol

## 2013-01-03 NOTE — Patient Instructions (Addendum)
Your physician recommends that you schedule a follow-up appointment in: 3 WEEKS with Dr Riley Kill  Your physician recommends that you continue on your current medications as directed. Please refer to the Current Medication list given to you today.

## 2013-01-10 ENCOUNTER — Other Ambulatory Visit: Payer: Self-pay | Admitting: *Deleted

## 2013-01-10 ENCOUNTER — Telehealth: Payer: Self-pay | Admitting: Cardiology

## 2013-01-10 DIAGNOSIS — I10 Essential (primary) hypertension: Secondary | ICD-10-CM

## 2013-01-10 MED ORDER — AMLODIPINE BESYLATE 5 MG PO TABS: 7.5000 mg | ORAL_TABLET | Freq: Every day | ORAL | Status: DC

## 2013-01-10 NOTE — Telephone Encounter (Signed)
Pt needs refill of amlodipine 5mg , [facility] [address] , out now

## 2013-01-13 ENCOUNTER — Telehealth: Payer: Self-pay | Admitting: *Deleted

## 2013-01-13 MED ORDER — BUDESONIDE-FORMOTEROL FUMARATE 160-4.5 MCG/ACT IN AERO: 2.0000 | INHALATION_SPRAY | Freq: Two times a day (BID) | RESPIRATORY_TRACT | Status: AC

## 2013-01-13 NOTE — Telephone Encounter (Signed)
-----   Message ----- From: Sharyn Blitz, RN  Sent: 01/03/2013 3:21 PM To: Barbaraann Share, MD  Subject: pt needs a refill on Symbicort Pt seen in our office today and requested a refill on Symbicort.  The pt would like Rx sent to [facility] in [place]. Thank you, Lauren RN   Patient has appt 06/2013 with [facility]. Will refill Symbicort 160 Inhale 2 puff twice daily #60 x 5    [facility] [place],

## 2013-01-16 ENCOUNTER — Other Ambulatory Visit: Payer: Self-pay | Admitting: *Deleted

## 2013-01-16 ENCOUNTER — Encounter (INDEPENDENT_AMBULATORY_CARE_PROVIDER_SITE_OTHER): Payer: Medicare Other

## 2013-01-16 DIAGNOSIS — R0989 Other specified symptoms and signs involving the circulatory and respiratory systems: Secondary | ICD-10-CM

## 2013-01-16 MED ORDER — LISINOPRIL 20 MG PO TABS: 20.0000 mg | ORAL_TABLET | Freq: Every day | ORAL | Status: DC

## 2013-01-24 ENCOUNTER — Encounter: Payer: Self-pay | Admitting: Cardiology

## 2013-01-24 ENCOUNTER — Ambulatory Visit (INDEPENDENT_AMBULATORY_CARE_PROVIDER_SITE_OTHER): Payer: Medicare Other | Admitting: Cardiology

## 2013-01-24 VITALS — BP 142/80 | HR 66 | Ht 68.0 in | Wt 229.0 lb

## 2013-01-24 DIAGNOSIS — E785 Hyperlipidemia, unspecified: Secondary | ICD-10-CM

## 2013-01-24 DIAGNOSIS — I2510 Atherosclerotic heart disease of native coronary artery without angina pectoris: Secondary | ICD-10-CM

## 2013-01-24 DIAGNOSIS — I10 Essential (primary) hypertension: Secondary | ICD-10-CM

## 2013-01-24 DIAGNOSIS — R0602 Shortness of breath: Secondary | ICD-10-CM

## 2013-01-24 NOTE — Assessment & Plan Note (Signed)
He is much improved now that carvedilol has been switched over to metoprolol.  No current wheezing.  FU two months with Dr. Shirlee Latch.

## 2013-01-24 NOTE — Progress Notes (Signed)
HPI:  The patient returns today in follow up.  His wheezing has improved and is now gone.  Denies any chest pain, and is tolerating the new medication.  Current Outpatient Prescriptions  Medication Sig Dispense Refill  . albuterol (PROAIR HFA) 108 (90 BASE) MCG/ACT inhaler Inhale 2 puffs into the lungs every 6 (six) hours as needed for wheezing.  1 Inhaler  11  . amLODipine (NORVASC) 5 MG tablet Take 1.5 tablets (7.5 mg total) by mouth daily.  45 tablet  6  . aspirin 81 MG tablet Take 81 mg by mouth daily.        . budesonide-formoterol (SYMBICORT) 160-4.5 MCG/ACT inhaler Inhale 2 puffs into the lungs 2 (two) times daily.  1 Inhaler  5  . diphenhydrAMINE (BENADRYL) 25 mg capsule Take 25 mg by mouth every 4 (four) hours as needed. For sleeplessness      . Garlic 1000 MG CAPS Take 1,000 mg by mouth 2 (two) times daily.       Marland Kitchen lisinopril (PRINIVIL,ZESTRIL) 20 MG tablet Take 1 tablet (20 mg total) by mouth daily.  90 tablet  3  . metoprolol (LOPRESSOR) 50 MG tablet Take 1 tablet (50 mg total) by mouth 2 (two) times daily.  60 tablet  3  . Omega-3 Fatty Acids (FISH OIL) 1000 MG CAPS Take 1,000 mg by mouth 2 (two) times daily.       Marland Kitchen omeprazole (PRILOSEC OTC) 20 MG tablet Take 20 mg by mouth every evening.       . rosuvastatin (CRESTOR) 10 MG tablet Take 1 tablet (10 mg total) by mouth every evening.  30 tablet  11  . STUDY MEDICATION Accelerate study       No current facility-administered medications for this visit.    Allergies  Allergen Reactions  . Carvedilol     Wheezing   . Codeine Other (See Comments)    hallucinations    Past Medical History  Diagnosis Date  . COPD (chronic obstructive pulmonary disease)     ?  Marland Kitchen Paroxysmal atrial fibrillation   . 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 Ladona Ridgel 6/10  . Syncope and collapse   . History of migraines   . GERD (gastroesophageal reflux disease)   . Dyspnea   . Hypertension   . Allergic rhinitis     . HLD (hyperlipidemia)   . CAD (coronary artery disease)     a. s/p NSTEMI 6/12: c/b VT/VF arrest req. defib and amio IV rx., pRCA occluded at cath and treated with BMS;  staged PCI of mCFX 90% on 04/28/11 with Promus DES;  residual CAD at cath 6/12: pLAD 20%, pCFX 40%, oOM1 70%, oRI 60-70%, EF 20-25%;   follow up Echo 6/12 with recovered EF:  EF 55-60%, mild LVH, mod LAE  . Heart attack 04/25/2011    Past Surgical History  Procedure Laterality Date  . Knee surgery  1971    Secondary to injury  . Cardiac electrophysiology study and ablation  05/22/09    Radiofrequency catheter ablation of AV reentrant tachycardia; Dr. Lewayne Bunting  . Heart stent placement  04/25/2011    Family History  Problem Relation Age of Onset  . Hypertension Mother   . Coronary artery disease Mother   . Diabetes Mother   . Heart disease Father   . Rheum arthritis Father   . Breast cancer Paternal Aunt     History   Social History  . Marital Status:  Widowed    Spouse Name: N/A    Number of Children: 2  . Years of Education: N/A   Occupational History  . 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 Smoking status: Former Smoker -- 2.00 packs/day for 50 years    Types: Cigarettes    Quit date: 11/24/1999  . Smokeless tobacco: Never Used  . Alcohol Use: Yes     Comment: Occasional beer drinker and had moonshine 6 months ago.  . Drug Use: No  . Sexually Active: Not on file   Other Topics Concern  . Not on file   Social History Narrative   His wife died last 08-07-2023- widowed.   Lives in [place] with his son.   60-pack-year smoker but quit 10 years ago negative.    ROS: Please see the HPI.  All other systems reviewed and negative.  PHYSICAL EXAM:  BP 142/80  Pulse 66  Ht 5\' 8"  (1.727 m)  Wt 229 lb (103.874 kg)  BMI 34.83 kg/m2  SpO2 97%  General: Well developed, well nourished, in no acute distress. Head:  Normocephalic and  atraumatic. Neck: no JVD Lungs: Clear to auscultation and percussion. Heart: Normal S1 and S2.  No murmur, rubs or gallops.  Pulses: Pulses normal in all 4 extremities. Extremities: No clubbing or cyanosis. No edema. Neurologic: Alert and oriented x 3.  EKG:  None  ASSESSMENT AND PLAN:

## 2013-01-24 NOTE — Assessment & Plan Note (Signed)
He remains stable from a cardiac standpoint.  No ischemic symptoms.  Continue medical therapy at the present time.

## 2013-01-24 NOTE — Assessment & Plan Note (Signed)
BP are reasonably controlled.  Amlodipine could be increased if necessary.  He will keep a log and bring to next OV.

## 2013-01-24 NOTE — Assessment & Plan Note (Signed)
Tolerates well and excellent control.

## 2013-01-24 NOTE — Patient Instructions (Addendum)
Your physician recommends that you schedule a follow-up appointment in: 2 months with Dr McLean.   

## 2013-03-27 ENCOUNTER — Ambulatory Visit (INDEPENDENT_AMBULATORY_CARE_PROVIDER_SITE_OTHER): Payer: Medicare Other | Admitting: Cardiology

## 2013-03-27 ENCOUNTER — Encounter: Payer: Self-pay | Admitting: Cardiology

## 2013-03-27 VITALS — BP 132/82 | HR 69 | Ht 68.0 in | Wt 226.0 lb

## 2013-03-27 DIAGNOSIS — I2584 Coronary atherosclerosis due to calcified coronary lesion: Secondary | ICD-10-CM

## 2013-03-27 DIAGNOSIS — I4891 Unspecified atrial fibrillation: Secondary | ICD-10-CM

## 2013-03-27 DIAGNOSIS — E785 Hyperlipidemia, unspecified: Secondary | ICD-10-CM

## 2013-03-27 DIAGNOSIS — I2510 Atherosclerotic heart disease of native coronary artery without angina pectoris: Secondary | ICD-10-CM

## 2013-03-27 LAB — LIPID PANEL
LDL Cholesterol: 39 mg/dL (ref 0–99)
Total CHOL/HDL Ratio: 2
Triglycerides: 139 mg/dL (ref 0.0–149.0)

## 2013-03-27 NOTE — Progress Notes (Signed)
Patient ID: [name], male   DOB: [birth year], 67 y.o.   MRN: [id number] PCP: Dr. Gabriel Cirri  67 yo with history of COPD and CAD s/p BMS to pLAD and DES to m LCx in 6/12 after NSTEMI presents for cardiology followup.  He has seen Dr. Riley Kill in the past and is seeing me for the first time today.  He initially had depressed EF with NSTEMI, but repeat echo later in 6/12 showed EF improved to 55-60% post-PCI.   He has been doing well recently.  No chest pain.  He denies exertional dyspnea.  Main limitation is hip pain if he walks long distances.  He is still working part time in heating and air conditioning.  He is no longer smoking.   Labs (2/14): K 3.9, creatinine 1.6, BNP 89, LDL 28, HDL 89  PMH: 1. CKD 2. COPD: Coreg triggered wheezing.  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  5. Migraines 6. GERD 7. HTN 8. Hyperlipidemia 9. CAD: NSTEMI in 6/12 complicated by VT arrest.  BMS to proximal LAD with staged DES to mid LCx. Initial EF 20-25%.  Repeat echo in 6/12 post-PCI showed EF 55-60%, mild LVH.  SH: Widower with 2 children.  Lives in [place].  Works part time Psychologist, educational.  Prior tobacco.  Occasional ETOH.    FH: Mother with CAD.   ROS: All systems reviewed and negative except as per HPI.   Current Outpatient Prescriptions  Medication Sig Dispense Refill  . albuterol (PROAIR HFA) 108 (90 BASE) MCG/ACT inhaler Inhale 2 puffs into the lungs every 6 (six) hours as needed for wheezing.  1 Inhaler  11  . amLODipine (NORVASC) 5 MG tablet Take 1.5 tablets (7.5 mg total) by mouth daily.  45 tablet  6  . aspirin 81 MG tablet Take 81 mg by mouth daily.        . budesonide-formoterol (SYMBICORT) 160-4.5 MCG/ACT inhaler Inhale 2 puffs into the lungs 2 (two) times daily.  1 Inhaler  5  . Garlic 1000 MG CAPS Take 1,000 mg by mouth 2 (two) times daily.       Marland Kitchen lisinopril (PRINIVIL,ZESTRIL) 20 MG tablet Take 1 tablet (20 mg total) by mouth daily.  90 tablet   3  . metoprolol (LOPRESSOR) 50 MG tablet Take 1 tablet (50 mg total) by mouth 2 (two) times daily.  60 tablet  3  . Omega-3 Fatty Acids (FISH OIL) 1000 MG CAPS Take 1,000 mg by mouth 2 (two) times daily.       Marland Kitchen omeprazole (PRILOSEC OTC) 20 MG tablet Take 20 mg by mouth every evening.       . rosuvastatin (CRESTOR) 10 MG tablet Take 1 tablet (10 mg total) by mouth every evening.  30 tablet  11  . STUDY MEDICATION Accelerate study       No current facility-administered medications for this visit.    BP 132/82  Pulse 69  Ht 5\' 8"  (1.727 m)  Wt 226 lb (102.513 kg)  BMI 34.37 kg/m2  SpO2 97% General: NAD Neck: No JVD, no thyromegaly or thyroid nodule.  Lungs: Distant breath sounds.  CV: Nondisplaced PMI.  Heart regular S1/S2, no S3/S4, no murmur.  No peripheral edema.  No carotid bruit.    Abdomen: Soft, nontender, no hepatosplenomegaly, no distention.  Neurologic: Alert and oriented x 3.  Psych: Normal affect. Extremities: No clubbing or cyanosis.   Assessment/Plan: 1. CAD: No ischemic symptoms.  He has  done well since NSTEMI in 6/12.  Last echo showed preserved EF.  He will continue ASA 81, statin, ACEI, and metoprolol.  He was on Coreg but it triggered wheezing.  2. Hyperlipidemia: Excellent lipids in 2/14.  Continue current statin.  He is also on the Accelerate study drug.  3. CKD: Last creatinine 1.6 (stable compared to the past).  4. Obesity: I encouraged him to start walking for exercise.  I would like to see him lose 15 lbs over the next year.  5. Paroxysmal atrial fibrillation: He carries this diagnosis but has had no recent occurrence and no tachypalpitations.  At this time, he is not on anticoagulation.  If he has another episode, he should be anticoagulated.   [name] 03/27/2013

## 2013-03-27 NOTE — Patient Instructions (Addendum)
Your physician recommends that you have  a FASTING lipid profile today.   Your physician wants you to follow-up in: 6 months with Dr Shirlee Latch. (November 2014).  You will receive a reminder letter in the mail two months in advance. If you don't receive a letter, please call our office to schedule the follow-up appointment.

## 2013-05-08 ENCOUNTER — Other Ambulatory Visit: Payer: Self-pay | Admitting: Cardiology

## 2013-06-09 ENCOUNTER — Other Ambulatory Visit: Payer: Self-pay | Admitting: Cardiology

## 2013-06-13 ENCOUNTER — Other Ambulatory Visit: Payer: Self-pay | Admitting: *Deleted

## 2013-06-26 ENCOUNTER — Ambulatory Visit: Payer: Medicare Other | Admitting: Pulmonary Disease

## 2013-06-28 ENCOUNTER — Ambulatory Visit (INDEPENDENT_AMBULATORY_CARE_PROVIDER_SITE_OTHER): Payer: Medicare Other | Admitting: Physician Assistant

## 2013-06-28 ENCOUNTER — Encounter: Payer: Self-pay | Admitting: Physician Assistant

## 2013-06-28 VITALS — BP 150/84 | HR 59 | Ht 68.0 in | Wt 231.0 lb

## 2013-06-28 DIAGNOSIS — R0602 Shortness of breath: Secondary | ICD-10-CM

## 2013-06-28 DIAGNOSIS — I10 Essential (primary) hypertension: Secondary | ICD-10-CM

## 2013-06-28 DIAGNOSIS — R609 Edema, unspecified: Secondary | ICD-10-CM

## 2013-06-28 DIAGNOSIS — I4891 Unspecified atrial fibrillation: Secondary | ICD-10-CM

## 2013-06-28 DIAGNOSIS — I2510 Atherosclerotic heart disease of native coronary artery without angina pectoris: Secondary | ICD-10-CM

## 2013-06-28 DIAGNOSIS — R55 Syncope and collapse: Secondary | ICD-10-CM

## 2013-06-28 LAB — BASIC METABOLIC PANEL
CO2: 26 mEq/L (ref 19–32)
Chloride: 108 mEq/L (ref 96–112)
Creatinine, Ser: 1.5 mg/dL (ref 0.4–1.5)
Potassium: 3.7 mEq/L (ref 3.5–5.1)
Sodium: 143 mEq/L (ref 135–145)

## 2013-06-28 LAB — BRAIN NATRIURETIC PEPTIDE: Pro B Natriuretic peptide (BNP): 143 pg/mL — ABNORMAL HIGH (ref 0.0–100.0)

## 2013-06-28 MED ORDER — FUROSEMIDE 40 MG PO TABS: 40.0000 mg | ORAL_TABLET | Freq: Every day | ORAL | Status: DC

## 2013-06-28 MED ORDER — POTASSIUM CHLORIDE CRYS ER 20 MEQ PO TBCR: 20.0000 meq | EXTENDED_RELEASE_TABLET | Freq: Every day | ORAL | Status: DC

## 2013-06-28 NOTE — Assessment & Plan Note (Addendum)
Patient presents today with lower extremity edema and fluid overload. We'll add Lasix 40 mg daily, potassium 20 mEq daily. We will check a BMET and BNP as well as a 2-D echo to reassess his LV function. I suspect most of the fluid overload is due to hypertension and excessive sodium. He's been given a 2 g sodium diet. He is also been asked to cut back on his alcohol intake. Follow up with Dr. Shirlee Latch in one month.

## 2013-06-28 NOTE — Assessment & Plan Note (Signed)
Patient's inhalers aren't helping him like they used to. I suspect he got some fluid overload. I will treat him with Lasix. We will reassess his LV function with 2-D echo. 2 g sodium diet. Checking labs today.

## 2013-06-28 NOTE — Assessment & Plan Note (Signed)
Blood pressure is elevated today. Have added Lasix and 2 g sodium diet.

## 2013-06-28 NOTE — Progress Notes (Signed)
HPI: This is a 67 year old male patient Dr. Shirlee Latch who has history of coronary artery disease status post BMS to the LAD and drug-eluting stent to the circumflex and 6/12 after NSTEMI complicated by VT arrest. Initial ejection fraction was 20% but repeat echo later in 6/12 showed EF improved to 55-60% post-PCI. He also has COPD, obesity, hyperlipidemia, and chronic kidney disease, last creatinine 1.6. 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 Ladona Ridgel in 6/10.  Patient comes in today complaining of several week history of leg swelling and shortness of breath it has not resolved by using his inhaler. His blood pressure is up and niece gained about 5 pounds since his last office visit. He does eat out some anger to more sodium in his diet that he should. He also drinks about a sixpack of beer several nights a week. He denies any chest pain, palpitations, dizziness, or presyncope. He says he is taking all his medications as prescribed.  Allergies: -- Carvedilol    --  Wheezing  -- Codeine -- Other (See Comments)   --  hallucinations  Current Outpatient Prescriptions on File Prior to Visit: albuterol (PROAIR HFA) 108 (90 BASE) MCG/ACT inhaler, Inhale 2 puffs into the lungs every 6 (six) hours as needed for wheezing., Disp: 1 Inhaler, Rfl: 11 amLODipine (NORVASC) 5 MG tablet, Take 1.5 tablets (7.5 mg total) by mouth daily., Disp: 45 tablet, Rfl: 6 aspirin 81 MG tablet, Take 81 mg by mouth daily.  , Disp: , Rfl:  budesonide-formoterol (SYMBICORT) 160-4.5 MCG/ACT inhaler, Inhale 2 puffs into the lungs 2 (two) times daily., Disp: 1 Inhaler, Rfl: 5 Garlic 1000 MG CAPS, Take 1,000 mg by mouth 2 (two) times daily. , Disp: , Rfl:  lisinopril (PRINIVIL,ZESTRIL) 20 MG tablet, Take 1 tablet (20 mg total) by mouth daily., Disp: 90 tablet, Rfl: 3 metoprolol (LOPRESSOR) 50 MG tablet, TAKE ONE TABLET BY MOUTH TWICE DAILY, Disp: 60 tablet, Rfl: 5 Omega-3 Fatty Acids (FISH OIL) 1000  MG CAPS, Take 1,000 mg by mouth 2 (two) times daily. , Disp: , Rfl:  omeprazole (PRILOSEC OTC) 20 MG tablet, Take 20 mg by mouth every evening. , Disp: , Rfl:  rosuvastatin (CRESTOR) 10 MG tablet, Take 1 tablet (10 mg total) by mouth every evening., Disp: 30 tablet, Rfl: 11 STUDY MEDICATION, Accelerate study, Disp: , Rfl:   No current facility-administered medications on file prior to visit.   Past Medical History:   COPD (chronic obstructive pulmonary disease)                   Comment:?   Paroxysmal atrial fibrillation                               Supraventricular tachycardia                                   Comment:AVNRT; Status post ablation by Dr. Ladona Ridgel 6/10   Syncope and collapse                                         History of migraines  GERD (gastroesophageal reflux disease)                       Dyspnea                                                      Hypertension                                                 Allergic rhinitis                                            HLD (hyperlipidemia)                                         CAD (coronary artery disease)                                  Comment:a. s/p NSTEMI 6/12: c/b VT/VF arrest req. defib              and amio IV rx., pRCA occluded at cath and               treated with BMS;  staged PCI of mCFX 90% on               04/28/11 with Promus DES;  residual CAD at cath               6/12: pLAD 20%, pCFX 40%, oOM1 70%, oRI 60-70%,              EF 20-25%;   follow up Echo 6/12 with recovered              EF:  EF 55-60%, mild LVH, mod LAE   Heart attack                                    04/25/2011    Past Surgical History:   KNEE SURGERY                                     1971           Comment:Secondary to injury   CARDIAC ELECTROPHYSIOLOGY STUDY AND ABLATION     05/22/09        Comment:Radiofrequency catheter ablation of AV               reentrant tachycardia; Dr. Lewayne Bunting   heart stent placement                            04/25/2011    Review of patient's family history indicates:   Hypertension  Mother                   Coronary artery disease        Mother                   Diabetes                       Mother                   Heart disease                  Father                   Rheum arthritis                Father                   Breast cancer                  Paternal Aunt            Social History   Marital Status: Widowed             Spouse Name:                      Years of Education:                 Number of children: 2           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   Drug Use: No             Sexual Activity: Not on file        Other Topics            Concern   None on file  Social History Narrative   His wife died last 19-Aug-2023- widowed.   Lives in [place] with his son.   60-pack-year smoker but quit 10 years ago negative.    ROS: See history of present illness otherwise negative   PHYSICAL EXAM: Obese, in no acute distress. Neck: No JVD, HJR, Bruit, or thyroid enlargement  Lungs: Decreased breath sounds with rales at  the bases  Cardiovascular: RRR, PMI not displaced, positive S4, 1/6 systolic murmur at the left sternal border, no bruit, thrill, or heave.  Abdomen: BS normal. Soft without organomegaly, masses, lesions or tenderness.  Extremities: +1 edema bilaterally up to his knees, without cyanosis, clubbing. Good distal pulses  bilateral  SKin: Warm, no lesions or rashes   Musculoskeletal: No deformities  Neuro: no focal signs  BP 150/84  Pulse 59  Ht 5\' 8"  (1.727 m)  Wt 231 lb (104.781 kg)  BMI 35.13 kg/m2    EKG: Sinus bradycardia 59 beats per minute, nonspecific ST-T wave changes

## 2013-06-28 NOTE — Patient Instructions (Addendum)
Your physician has recommended you make the following change in your medication:   1.  Start Lasix 40mg  daily 2. Start Potassium daily  Decrease Alcohol intake  Your physician recommends that have lab work today: BMET, BNP  Your physician has requested that you have an echocardiogram. Echocardiography is a painless test that uses sound waves to create images of your heart. It provides your doctor with information about the size and shape of your heart and how well your heart's chambers and valves are working. This procedure takes approximately one hour. There are no restrictions for this procedure.  Your physician recommends that you schedule a follow-up appointment in: 1 month with Dr. Shirlee Latch  Start 2 gram Sodium Diet:  2 Gram Low Sodium Diet A 2 gram sodium diet restricts the amount of sodium in the diet to no more than 2 g or 2000 mg daily. Limiting the amount of sodium is often used to help lower blood pressure. It is important if you have heart, liver, or kidney problems. Many foods contain sodium for flavor and sometimes as a preservative. When the amount of sodium in a diet needs to be low, it is important to know what to look for when choosing foods and drinks. The following includes some information and guidelines to help make it easier for you to adapt to a low sodium diet. QUICK TIPS  Do not add salt to food.  Avoid convenience items and fast food.  Choose unsalted snack foods.  Buy lower sodium products, often labeled as "lower sodium" or "no salt added."  Check food labels to learn how much sodium is in 1 serving.  When eating at a restaurant, ask that your food be prepared with less salt or none, if possible. READING FOOD LABELS FOR SODIUM INFORMATION The nutrition facts label is a good place to find how much sodium is in foods. Look for products with no more than 500 to 600 mg of sodium per meal and no more than 150 mg per serving. Remember that 2 g = 2000  mg. The food label may also list foods as:  Sodium-free: Less than 5 mg in a serving.  Very low sodium: 35 mg or less in a serving.  Low-sodium: 140 mg or less in a serving.  Light in sodium: 50% less sodium in a serving. For example, if a food that usually has 300 mg of sodium is changed to become light in sodium, it will have 150 mg of sodium.  Reduced sodium: 25% less sodium in a serving. For example, if a food that usually has 400 mg of sodium is changed to reduced sodium, it will have 300 mg of sodium. CHOOSING FOODS Grains  Avoid: Salted crackers and snack items. Some cereals, including instant hot cereals. Bread stuffing and biscuit mixes. Seasoned rice or pasta mixes.  Choose: Unsalted snack items. Low-sodium cereals, oats, puffed wheat and rice, shredded wheat. English muffins and bread. Pasta. Meats  Avoid: Salted, canned, smoked, spiced, pickled meats, including fish and poultry. Bacon, ham, sausage, cold cuts, hot dogs, anchovies.  Choose: Low-sodium canned tuna and salmon. Fresh or frozen meat, poultry, and fish. Dairy  Avoid: Processed cheese and spreads. Cottage cheese. Buttermilk and condensed milk. Regular cheese.  Choose: Milk. Low-sodium cottage cheese. Yogurt. Sour cream. Low-sodium cheese. Fruits and Vegetables  Avoid: Regular canned vegetables. Regular canned tomato sauce and paste. Frozen vegetables in sauces. Olives. Rosita Fire. Relishes. Sauerkraut.  Choose: Low-sodium canned vegetables. Low-sodium tomato sauce and paste. Frozen  or fresh vegetables. Fresh and frozen fruit. Condiments  Avoid: Canned and packaged gravies. Worcestershire sauce. Tartar sauce. Barbecue sauce. Soy sauce. Steak sauce. Ketchup. Onion, garlic, and table salt. Meat flavorings and tenderizers.  Choose: Fresh and dried herbs and spices. Low-sodium varieties of mustard and ketchup. Lemon juice. Tabasco sauce. Horseradish. SAMPLE 2 GRAM SODIUM MEAL PLAN Breakfast / Sodium (mg)  1 cup  low-fat milk / 143 mg  2 slices whole-wheat toast / 270 mg  1 tbs heart-healthy margarine / 153 mg  1 hard-boiled egg / 139 mg  1 small orange / 0 mg Lunch / Sodium (mg)  1 cup raw carrots / 76 mg   cup hummus / 298 mg  1 cup low-fat milk / 143 mg   cup red grapes / 2 mg  1 whole-wheat pita bread / 356 mg Dinner / Sodium (mg)  1 cup whole-wheat pasta / 2 mg  1 cup low-sodium tomato sauce / 73 mg  3 oz lean ground beef / 57 mg  1 small side salad (1 cup raw spinach leaves,  cup cucumber,  cup yellow bell pepper) with 1 tsp olive oil and 1 tsp red wine vinegar / 25 mg Snack / Sodium (mg)  1 container low-fat vanilla yogurt / 107 mg  3 graham cracker squares / 127 mg Nutrient Analysis  Calories: 2033  Protein: 77 g  Carbohydrate: 282 g  Fat: 72 g  Sodium: 1971 mg Document Released: 11/09/2005 Document Revised: 02/01/2012 Document Reviewed: 02/10/2010 [employer] Patient Information 2014 [place], [place].

## 2013-06-28 NOTE — Assessment & Plan Note (Signed)
Stable without chest pain 

## 2013-06-30 ENCOUNTER — Ambulatory Visit (HOSPITAL_COMMUNITY): Payer: Medicare Other | Attending: Cardiology

## 2013-06-30 DIAGNOSIS — E785 Hyperlipidemia, unspecified: Secondary | ICD-10-CM | POA: Insufficient documentation

## 2013-06-30 DIAGNOSIS — Z8674 Personal history of sudden cardiac arrest: Secondary | ICD-10-CM | POA: Insufficient documentation

## 2013-06-30 DIAGNOSIS — R0989 Other specified symptoms and signs involving the circulatory and respiratory systems: Secondary | ICD-10-CM | POA: Insufficient documentation

## 2013-06-30 DIAGNOSIS — I2510 Atherosclerotic heart disease of native coronary artery without angina pectoris: Secondary | ICD-10-CM | POA: Insufficient documentation

## 2013-06-30 DIAGNOSIS — R0609 Other forms of dyspnea: Secondary | ICD-10-CM | POA: Insufficient documentation

## 2013-06-30 DIAGNOSIS — I129 Hypertensive chronic kidney disease with stage 1 through stage 4 chronic kidney disease, or unspecified chronic kidney disease: Secondary | ICD-10-CM | POA: Insufficient documentation

## 2013-06-30 DIAGNOSIS — J449 Chronic obstructive pulmonary disease, unspecified: Secondary | ICD-10-CM | POA: Insufficient documentation

## 2013-06-30 DIAGNOSIS — J4489 Other specified chronic obstructive pulmonary disease: Secondary | ICD-10-CM | POA: Insufficient documentation

## 2013-06-30 DIAGNOSIS — R0602 Shortness of breath: Secondary | ICD-10-CM

## 2013-06-30 DIAGNOSIS — I4891 Unspecified atrial fibrillation: Secondary | ICD-10-CM | POA: Insufficient documentation

## 2013-06-30 DIAGNOSIS — N189 Chronic kidney disease, unspecified: Secondary | ICD-10-CM | POA: Insufficient documentation

## 2013-06-30 DIAGNOSIS — E669 Obesity, unspecified: Secondary | ICD-10-CM | POA: Insufficient documentation

## 2013-06-30 NOTE — Progress Notes (Signed)
Echocardiogram performed.  

## 2013-07-03 ENCOUNTER — Encounter: Payer: Self-pay | Admitting: Pulmonary Disease

## 2013-07-03 ENCOUNTER — Ambulatory Visit (INDEPENDENT_AMBULATORY_CARE_PROVIDER_SITE_OTHER): Payer: Medicare Other | Admitting: Pulmonary Disease

## 2013-07-03 VITALS — BP 138/78 | HR 58 | Temp 97.1°F | Ht 68.0 in | Wt 233.6 lb

## 2013-07-03 DIAGNOSIS — J449 Chronic obstructive pulmonary disease, unspecified: Secondary | ICD-10-CM

## 2013-07-03 NOTE — Assessment & Plan Note (Signed)
The patient has moderate COPD, and appears to be at baseline on his current bronchodilator regimen.  There is no evidence for spasm on exam, and I suspect most of his persistent dyspnea on exertion is related to his obesity and deconditioning.  He also tells me that he has intermittent lower extremity edema which is always associated with worsening shortness of breath.  His breathing improves once he gets rid of the fluid.  Finally, the patient is describing classic upper airway pseudo-wheezing, especially at night, and may do better off his ace inhibitor.  I've asked him to discuss this with his primary physician or his cardiologist.  In the meantime, I've asked him to continue on his bronchodilators, and to followup with me in 6 months.

## 2013-07-03 NOTE — Progress Notes (Signed)
  Subjective:    Patient ID: [name], male    DOB: 1946/08/17, 67 y.o.   MRN: [id number]  HPI The patient comes in today for followup of his known moderate COPD.  He feels that his breathing is near his usual baseline, although he has episodes of worsening shortness of breath that is usually associated with swelling in his lower extremities.  This usually improves when his swelling goes down.  He is having classic upper airway pseudo-wheezing, and it should be noted he is still on an ACE inhibitor.  He has not had any recent acute exacerbation or pulmonary infection.   Review of Systems  Constitutional: Negative for fever and unexpected weight change.  HENT: Negative for ear pain, nosebleeds, congestion, sore throat, rhinorrhea, sneezing, trouble swallowing, dental problem, postnasal drip and sinus pressure.   Eyes: Negative for redness and itching.  Respiratory: Positive for shortness of breath and wheezing. Negative for cough and chest tightness.   Cardiovascular: Positive for leg swelling. Negative for palpitations.  Gastrointestinal: Negative for nausea and vomiting.  Genitourinary: Negative for dysuria.  Musculoskeletal: Negative for joint swelling.  Skin: Negative for rash.  Neurological: Negative for headaches.  Hematological: Does not bruise/bleed easily.  Psychiatric/Behavioral: Negative for dysphoric mood. The patient is not nervous/anxious.        Objective:   Physical Exam Obese male in no acute distress Nose without purulence or discharge noted Neck without lymphadenopathy or thyromegaly Chest with decreased breath sounds, minimal basilar crackles, no wheezing Cardiac exam with regular rate and rhythm, 1/6 systolic murmur Lower extremities with 1+ edema bilaterally, no cyanosis Alert and oriented, moves all 4 extremities.       Assessment & Plan:

## 2013-07-03 NOTE — Patient Instructions (Addendum)
Stay on symbicort as you are doing. Work on weight loss and some type of conditioning program Watch your fluid carefully, and call your cardiologist if it begins to accumulate. Would discuss with your primary doctor or cardiologist coming off lisinopril to see if this helps your "wheezing". followup with me in 6mos if doing well.

## 2013-07-22 ENCOUNTER — Other Ambulatory Visit: Payer: Self-pay | Admitting: Cardiology

## 2013-07-26 ENCOUNTER — Ambulatory Visit (INDEPENDENT_AMBULATORY_CARE_PROVIDER_SITE_OTHER): Payer: Medicare Other | Admitting: Physician Assistant

## 2013-07-26 ENCOUNTER — Encounter: Payer: Self-pay | Admitting: Physician Assistant

## 2013-07-26 VITALS — BP 124/75 | HR 60 | Wt 231.0 lb

## 2013-07-26 DIAGNOSIS — R0602 Shortness of breath: Secondary | ICD-10-CM

## 2013-07-26 DIAGNOSIS — I4891 Unspecified atrial fibrillation: Secondary | ICD-10-CM

## 2013-07-26 DIAGNOSIS — R252 Cramp and spasm: Secondary | ICD-10-CM

## 2013-07-26 DIAGNOSIS — I2510 Atherosclerotic heart disease of native coronary artery without angina pectoris: Secondary | ICD-10-CM

## 2013-07-26 DIAGNOSIS — I10 Essential (primary) hypertension: Secondary | ICD-10-CM

## 2013-07-26 LAB — BASIC METABOLIC PANEL
CO2: 29 mEq/L (ref 19–32)
Calcium: 8.9 mg/dL (ref 8.4–10.5)
Glucose, Bld: 106 mg/dL — ABNORMAL HIGH (ref 70–99)
Potassium: 4.4 mEq/L (ref 3.5–5.1)
Sodium: 137 mEq/L (ref 135–145)

## 2013-07-26 MED ORDER — HYDROCHLOROTHIAZIDE 25 MG PO TABS: 25.0000 mg | ORAL_TABLET | Freq: Every day | ORAL | Status: DC

## 2013-07-26 NOTE — Addendum Note (Signed)
Addended by: Tonita Phoenix on: 07/26/2013 11:40 AM   Modules accepted: Orders

## 2013-07-26 NOTE — Patient Instructions (Signed)
Your physician recommends that you schedule a follow-up appointment in:  2 WEEKS WITH MICHELE LENZE [facility]  Your physician has recommended you make the following change in your medication:  STOP  FUROSEMIDE AND POTASSIUM  AND START  HCTZ  25 MG  1 EVERY DAY Your physician recommends that you return for lab work in: TODAY  BMET   LEG CRAMPS

## 2013-07-26 NOTE — Assessment & Plan Note (Signed)
Patient did not tolerate the Lasix because of severe leg cramps. He did have a normal potassium we checked it. 2-D echo shows slight change in his LV function and EF now 45-50% with grade 1 diastolic dysfunction. His weight is the same as when I saw him 2 weeks ago and he continues to have dyspnea on exertion and edema. We will try low-dose hydrochlorothiazide 25 mg daily to see if this doesn't help without causing leg cramps. I believe he definitely needs a diuretic. He has cut back on his sodium intake but needs to cut back more.

## 2013-07-26 NOTE — Assessment & Plan Note (Signed)
Maintaining normal sinus rhythm 

## 2013-07-26 NOTE — Progress Notes (Signed)
HPI: This is a 67 year old male patient Dr. Shirlee Latch who has history of coronary artery disease status post BMS to the LAD and drug-eluting stent to the circumflex and 6/12 after NSTEMI complicated by VT arrest. Initial ejection fraction was 20% but repeat echo later in 6/12 showed EF improved to 55-60% post-PCI. He also has COPD, obesity, hyperlipidemia, and chronic kidney disease, last creatinine 1.6. 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 Ladona Ridgel in 6/10.  I saw this patient 2 weeks ago complaining of several week history of leg swelling and shortness of breath it has not resolved by using his inhaler. His blood pressure is up and he had gained about 5 pounds since his last office visit. He does eat out some and gets more sodium in his diet that he should. He also drinks about a sixpack of beer several nights a week. He denied any chest pain, palpitations, dizziness, or presyncope. He says he was taking all his medications as prescribed. I prescribed Lasix 40 mg daily as well as potassium 20 mEq daily. The patient says he had severe leg cramps at night every time he tried taking the Lasix so he had to quit. He only took a total of 4 doses. He said he did notice decrease in swelling after he took the Lasix but he just can't tolerate it. His BNP was elevated 143 and 2-D echo showed slightly more depressed EF of 45-50%. Please see below for details. His swelling, dyspnea on exertion is about the same as well as his weight. He has cut back on his sodium intake and his alcohol intake. He is only drinking 24 ounces a day now.    Allergies: -- Carvedilol    --  Wheezing  -- Codeine -- Other (See Comments)   --  hallucinations  Current Outpatient Prescriptions on File Prior to Visit: albuterol (PROAIR HFA) 108 (90 BASE) MCG/ACT inhaler, Inhale 2 puffs into the lungs every 6 (six) hours as needed for wheezing., Disp: 1 Inhaler, Rfl: 11 amLODipine (NORVASC) 5 MG tablet,  Take 1.5 tablets (7.5 mg total) by mouth daily., Disp: 45 tablet, Rfl: 6 aspirin 81 MG tablet, Take 81 mg by mouth daily.  , Disp: , Rfl:  budesonide-formoterol (SYMBICORT) 160-4.5 MCG/ACT inhaler, Inhale 2 puffs into the lungs 2 (two) times daily., Disp: 1 Inhaler, Rfl: 5 Garlic 1000 MG CAPS, Take 1,000 mg by mouth 2 (two) times daily. , Disp: , Rfl:  lisinopril (PRINIVIL,ZESTRIL) 20 MG tablet, Take 1 tablet (20 mg total) by mouth daily., Disp: 90 tablet, Rfl: 3 metoprolol (LOPRESSOR) 50 MG tablet, TAKE ONE TABLET BY MOUTH TWICE DAILY, Disp: 60 tablet, Rfl: 5 Omega-3 Fatty Acids (FISH OIL) 1000 MG CAPS, Take 1,000 mg by mouth 2 (two) times daily. , Disp: , Rfl:  omeprazole (PRILOSEC OTC) 20 MG tablet, Take 20 mg by mouth every evening. , Disp: , Rfl:  rosuvastatin (CRESTOR) 10 MG tablet, Take 1 tablet (10 mg total) by mouth every evening., Disp: 30 tablet, Rfl: 11 STUDY MEDICATION, Accelerate study, Disp: , Rfl:   No current facility-administered medications on file prior to visit.   Past Medical History:   COPD (chronic obstructive pulmonary disease)                   Comment:?   Paroxysmal atrial fibrillation  Supraventricular tachycardia                                   Comment:AVNRT; Status post ablation by Dr. Ladona Ridgel 6/10   Syncope and collapse                                         History of migraines                                         GERD (gastroesophageal reflux disease)                       Dyspnea                                                      Hypertension                                                 Allergic rhinitis                                            HLD (hyperlipidemia)                                         CAD (coronary artery disease)                                  Comment:a. s/p NSTEMI 6/12: c/b VT/VF arrest req. defib              and amio IV rx., pRCA occluded at cath and               treated with BMS;   staged PCI of mCFX 90% on               04/28/11 with Promus DES;  residual CAD at cath               6/12: pLAD 20%, pCFX 40%, oOM1 70%, oRI 60-70%,              EF 20-25%;   follow up Echo 6/12 with recovered              EF:  EF 55-60%, mild LVH, mod LAE   Heart attack                                    04/25/2011    Past Surgical History:   KNEE SURGERY  1971           Comment:Secondary to injury   CARDIAC ELECTROPHYSIOLOGY STUDY AND ABLATION     05/22/09        Comment:Radiofrequency catheter ablation of AV               reentrant tachycardia; Dr. Lewayne Bunting   heart stent placement                            04/25/2011    Review of patient's family history indicates:   Hypertension                   Mother                   Coronary artery disease        Mother                   Diabetes                       Mother                   Heart disease                  Father                   Rheum arthritis                Father                   Breast cancer                  Paternal Aunt            Social History   Marital Status: Widowed             Spouse Name:                      Years of Education:                 Number of children: 2           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   Drug Use: No             Sexual Activity: Not on file        Other Topics            Concern   None on file  Social History Narrative   His wife died last 21-Aug-2023- widowed.   Lives in [place] with  his son.   60-pack-year smoker but quit 10 years ago negative.    ROS: See history of present illness otherwise negative   PHYSICAL EXAM: Obese, in no acute distress. Neck: No JVD, HJR, Bruit, or thyroid enlargement  Lungs: Decreased breath sounds with rales at the bases  Cardiovascular: RRR, PMI not displaced, positive S4, 1/6 systolic murmur at the left sternal border, no bruit, thrill, or heave.  Abdomen: BS normal. Soft without organomegaly, masses, lesions or tenderness.  Extremities: +1 edema ankles,otherwise without cyanosis, clubbing. Good distal pulses bilateral  SKin: Warm, no lesions or rashes   Musculoskeletal: No deformities  Neuro: no focal signs  BP 124/75  Pulse 60  Wt 231 lb (104.781 kg)  BMI 35.13 kg/m2     EKG: Sinus bradycardia 59 beats per minute, nonspecific ST-T wave changes  2Decho 06/30/13: Study Conclusions  - Left ventricle: The cavity size was normal. Wall thickness   was increased in a pattern of mild LVH. Systolic function   was mildly reduced. The estimated ejection fraction was in   the range of 45% to 50%. There is hypokinesis of the basal   inferior posterior myocardium. Doppler parameters are   consistent with abnormal left ventricular relaxation   (grade 1 diastolic dysfunction). - Mitral valve: Calcified annulus. - Left atrium: The atrium was mildly dilated.

## 2013-07-26 NOTE — Assessment & Plan Note (Signed)
Blood pressure better control

## 2013-07-26 NOTE — Assessment & Plan Note (Signed)
Stable without chest pain 

## 2013-08-05 ENCOUNTER — Other Ambulatory Visit: Payer: Self-pay | Admitting: Cardiology

## 2013-08-09 ENCOUNTER — Encounter: Payer: Self-pay | Admitting: Physician Assistant

## 2013-08-09 ENCOUNTER — Ambulatory Visit (INDEPENDENT_AMBULATORY_CARE_PROVIDER_SITE_OTHER): Payer: Medicare Other | Admitting: Physician Assistant

## 2013-08-09 VITALS — BP 130/78 | HR 78 | Ht 68.0 in | Wt 233.0 lb

## 2013-08-09 DIAGNOSIS — N289 Disorder of kidney and ureter, unspecified: Secondary | ICD-10-CM

## 2013-08-09 DIAGNOSIS — R609 Edema, unspecified: Secondary | ICD-10-CM

## 2013-08-09 DIAGNOSIS — I2510 Atherosclerotic heart disease of native coronary artery without angina pectoris: Secondary | ICD-10-CM

## 2013-08-09 DIAGNOSIS — I10 Essential (primary) hypertension: Secondary | ICD-10-CM

## 2013-08-09 DIAGNOSIS — I4891 Unspecified atrial fibrillation: Secondary | ICD-10-CM

## 2013-08-09 LAB — BASIC METABOLIC PANEL
BUN: 20 mg/dL (ref 6–23)
CO2: 28 mEq/L (ref 19–32)
Chloride: 105 mEq/L (ref 96–112)
Potassium: 4.2 mEq/L (ref 3.5–5.1)

## 2013-08-09 NOTE — Assessment & Plan Note (Signed)
Edema has resolved with 2 g sodium diet and hydrochlorothiazide. We will continue this regimen. Unfortunately his weight has gone up but I believe it is not due to fluid. Weight loss program recommended.

## 2013-08-09 NOTE — Progress Notes (Signed)
HPI:  This is a 67 year old male patient Dr. Shirlee Latch who has history of coronary artery disease status post BMS to the LAD and drug-eluting stent to the circumflex on 6/12 after NSTEMI complicated by VT arrest. Initial ejection fraction was 20% but repeat echo later in 6/12 showed EF improved to 55-60% post-PCI. He also has COPD, obesity, hyperlipidemia, and chronic kidney disease, last creatinine 1.6. 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 Ladona Ridgel in 6/10.  I saw this patient 4 weeks ago complaining of several week history of leg swelling and shortness of breath it has not resolved by using his inhaler. His blood pressure was up and he had gained about 5 pounds since his last office visit. He does gets more sodium in his diet than he should. He also was drinking about a sixpack of beer several nights a week.  I prescribed Lasix 40 mg daily as well as potassium 20 mEq daily. On follow up 2 weeks ago the patient said he had severe leg cramps at night every time he tried taking the Lasix so he had to quit. He only took a total of 4 doses. He said he did notice decrease in swelling after he took the Lasix but he just can't tolerate it. His BNP was elevated 143 and 2-D echo showed slightly more depressed EF of 45-50%. Please see below for details.I added HCTZ 25 mg and asked him to decrease his sodium intake even more. His swelling, dyspnea on exertion has improved but his weight is actually gone up 2 pounds.. He has cut back on his sodium intake and his alcohol intake. He is only drinking 24 ounces a day now. Still has some dyspnea on exertion but can do a low but more than he had been in today. He also is continues to eat out on occasion at [employer]. He doesn't know how much salt he is getting. He denies chest pain, palpitations, dyspnea at rest, dizziness or presyncope. His last creatinine was 1.7 on September 3.   Allergies:  -- Carvedilol    --  Wheezing  --  Codeine -- Other (See Comments)   --  hallucinations  Current Outpatient Prescriptions on File Prior to Visit: albuterol (PROAIR HFA) 108 (90 BASE) MCG/ACT inhaler, Inhale 2 puffs into the lungs every 6 (six) hours as needed for wheezing., Disp: 1 Inhaler, Rfl: 11 amLODipine (NORVASC) 5 MG tablet, TAKE ONE & ONE-HALF TABLETS BY MOUTH ONCE DAILY, Disp: 45 tablet, Rfl: 3 aspirin 81 MG tablet, Take 81 mg by mouth daily.  , Disp: , Rfl:  budesonide-formoterol (SYMBICORT) 160-4.5 MCG/ACT inhaler, Inhale 2 puffs into the lungs 2 (two) times daily., Disp: 1 Inhaler, Rfl: 5 CRESTOR 10 MG tablet, TAKE ONE TABLET BY MOUTH IN THE EVENING, Disp: 30 tablet, Rfl: 3 Garlic 1000 MG CAPS, Take 1,000 mg by mouth 2 (two) times daily. , Disp: , Rfl:  hydrochlorothiazide (HYDRODIURIL) 25 MG tablet, Take 1 tablet (25 mg total) by mouth daily., Disp: 30 tablet, Rfl: 11 lisinopril (PRINIVIL,ZESTRIL) 20 MG tablet, Take 1 tablet (20 mg total) by mouth daily., Disp: 90 tablet, Rfl: 3 metoprolol (LOPRESSOR) 50 MG tablet, TAKE ONE TABLET BY MOUTH TWICE DAILY, Disp: 60 tablet, Rfl: 5 Omega-3 Fatty Acids (FISH OIL) 1000 MG CAPS, Take 1,000 mg by mouth 2 (two) times daily. , Disp: , Rfl:  omeprazole (PRILOSEC OTC) 20 MG tablet, Take 20 mg by mouth every evening. , Disp: , Rfl:  STUDY MEDICATION, Accelerate  study, Disp: , Rfl:  topiramate (TOPAMAX) 200 MG tablet, Take 200 mg by mouth as needed., Disp: , Rfl:   No current facility-administered medications on file prior to visit.   Past Medical History:   COPD (chronic obstructive pulmonary disease)                   Comment:?   Paroxysmal atrial fibrillation                               Supraventricular tachycardia                                   Comment:AVNRT; Status post ablation by Dr. Ladona Ridgel 6/10   Syncope and collapse                                         History of migraines                                         GERD (gastroesophageal reflux disease)                        Dyspnea                                                      Hypertension                                                 Allergic rhinitis                                            HLD (hyperlipidemia)                                         CAD (coronary artery disease)                                  Comment:a. s/p NSTEMI 6/12: c/b VT/VF arrest req. defib              and amio IV rx., pRCA occluded at cath and               treated with BMS;  staged PCI of mCFX 90% on               04/28/11 with Promus DES;  residual CAD at cath               6/12: pLAD 20%, pCFX 40%, oOM1 70%, oRI 60-70%,              EF 20-25%;  follow up Echo 6/12 with recovered              EF:  EF 55-60%, mild LVH, mod LAE   Heart attack                                    04/25/2011    Past Surgical History:   KNEE SURGERY                                     1971           Comment:Secondary to injury   CARDIAC ELECTROPHYSIOLOGY STUDY AND ABLATION     05/22/09        Comment:Radiofrequency catheter ablation of AV               reentrant tachycardia; Dr. Lewayne Bunting   heart stent placement                            04/25/2011    Review of patient's family history indicates:   Hypertension                   Mother                   Coronary artery disease        Mother                   Diabetes                       Mother                   Heart disease                  Father                   Rheum arthritis                Father                   Breast cancer                  Paternal Aunt            Social History   Marital Status: Widowed             Spouse Name:                      Years of Education:                 Number of children: 2           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   Drug Use: No             Sexual Activity: Not on file        Other Topics            Concern   None on file  Social History Narrative   His wife died last 05-Aug-2023- widowed.   Lives in [place] with his son.   60-pack-year smoker but quit 10 years ago negative.    ROS: See history of present illness otherwise negative   PHYSICAL EXAM: Obese, in no acute distress. Neck: No JVD, HJR, Bruit, or thyroid enlargement  Lungs: Decreased breath sounds throughout with fine crackles at the bases otherwise clear  Cardiovascular: RRR, PMI not displaced, heart sounds distant, no murmurs, gallops, bruit, thrill, or heave.  Abdomen: Obese BS normal. Soft without organomegaly, masses, lesions or tenderness.  Extremities: without cyanosis, clubbing or edema. Good distal pulses bilateral  SKin: Warm, no lesions or rashes   Musculoskeletal: No deformities  Neuro: no focal signs  BP 130/78  Pulse 78  Ht 5\' 8"  (1.727 m)  Wt 233 lb (105.688 kg)  BMI 35.44 kg/m2   2Decho 06/30/13: Study Conclusions  - Left ventricle: The cavity size was normal. Wall thickness   was increased in a pattern of mild LVH. Systolic function   was mildly reduced. The estimated ejection fraction was in   the range of 45% to 50%. There is hypokinesis of the basal   inferior posterior myocardium. Doppler parameters are   consistent with abnormal left ventricular relaxation   (grade 1 diastolic dysfunction). - Mitral valve: Calcified annulus. - Left atrium: The atrium was mildly dilated.

## 2013-08-09 NOTE — Assessment & Plan Note (Signed)
Creatinine 1.7 on 07/26/13. Recheck today.

## 2013-08-09 NOTE — Patient Instructions (Addendum)
Your physician recommends that you have labs today: BMET  Your physician encouraged you to lose weight for better health.  Your physician recommends that you continue on your current medications as directed. Please refer to the Current Medication list given to you today.  Keep follow-up appointment with Dr. Shirlee Latch.

## 2013-08-09 NOTE — Assessment & Plan Note (Signed)
No recurrent chest pain. 

## 2013-08-09 NOTE — Assessment & Plan Note (Signed)
Controlled.  

## 2013-08-16 ENCOUNTER — Telehealth: Payer: Self-pay | Admitting: *Deleted

## 2013-08-16 DIAGNOSIS — I2510 Atherosclerotic heart disease of native coronary artery without angina pectoris: Secondary | ICD-10-CM

## 2013-08-16 DIAGNOSIS — I10 Essential (primary) hypertension: Secondary | ICD-10-CM

## 2013-08-16 NOTE — Telephone Encounter (Signed)
Patient notified of lab results. Verbalized agreement with current treatment plan. Will return by October 15th for repeat BMET.

## 2013-08-16 NOTE — Progress Notes (Signed)
Quick Note:  Patient notified of lab results from [facility] about the same. Repeat bmet in 2-3 weeks" - per Dyann Kief, PA). Verbalized agreement with current treatment plan and he will return for repeat bmet by September 06, 2013. ______

## 2013-09-01 ENCOUNTER — Other Ambulatory Visit: Payer: Self-pay | Admitting: *Deleted

## 2013-09-05 ENCOUNTER — Ambulatory Visit (INDEPENDENT_AMBULATORY_CARE_PROVIDER_SITE_OTHER): Payer: Medicare Other | Admitting: Cardiology

## 2013-09-05 ENCOUNTER — Encounter: Payer: Self-pay | Admitting: Cardiology

## 2013-09-05 ENCOUNTER — Other Ambulatory Visit: Payer: Medicare Other

## 2013-09-05 VITALS — BP 132/72 | HR 61 | Ht 68.0 in | Wt 233.0 lb

## 2013-09-05 DIAGNOSIS — I5032 Chronic diastolic (congestive) heart failure: Secondary | ICD-10-CM

## 2013-09-05 DIAGNOSIS — I2510 Atherosclerotic heart disease of native coronary artery without angina pectoris: Secondary | ICD-10-CM

## 2013-09-05 DIAGNOSIS — I10 Essential (primary) hypertension: Secondary | ICD-10-CM

## 2013-09-05 DIAGNOSIS — I509 Heart failure, unspecified: Secondary | ICD-10-CM

## 2013-09-05 DIAGNOSIS — I4891 Unspecified atrial fibrillation: Secondary | ICD-10-CM

## 2013-09-05 DIAGNOSIS — R0609 Other forms of dyspnea: Secondary | ICD-10-CM

## 2013-09-05 DIAGNOSIS — N289 Disorder of kidney and ureter, unspecified: Secondary | ICD-10-CM

## 2013-09-05 LAB — BASIC METABOLIC PANEL
BUN: 32 mg/dL — ABNORMAL HIGH (ref 6–23)
CO2: 25 mEq/L (ref 19–32)
Calcium: 9.1 mg/dL (ref 8.4–10.5)
Glucose, Bld: 109 mg/dL — ABNORMAL HIGH (ref 70–99)
Potassium: 4.4 mEq/L (ref 3.5–5.1)
Sodium: 139 mEq/L (ref 135–145)

## 2013-09-05 MED ORDER — ATORVASTATIN CALCIUM 20 MG PO TABS: 20.0000 mg | ORAL_TABLET | Freq: Every day | ORAL | Status: DC

## 2013-09-05 NOTE — Progress Notes (Signed)
Patient ID: [name], male   DOB: 1946/06/03, 67 y.o.   MRN: [id number] PCP: Dr. Gabriel Cirri  67 yo with history of COPD and CAD s/p BMS to pLAD and DES to m LCx in 6/12 after NSTEMI presents for cardiology followup.  Since I last saw him, he saw the PA in this office with complaint of increased weight and exertional dyspnea.  He was thought to be volume overloaded and was started on Lasix 40 mg daily.  He developed severe leg cramps with Lasix despite potassium repletion, so he was switched to HCTZ which he has tolerated without problems. Weight is actually up about 6 lbs since last appointment.  He had an echo done in 8/14 showing EF 45-50% with basal inferior hypokinesis.    Currently, he is short of breath when he lifts a heavy load at work. He is short of breath if he rushes up a flight of steps.  He has no problems walking on flat ground.   No chest pain.  No orthopnea or PND.  He has some leg soreness and wants to switch off Crestor because of myalgias and expense.    ECG: NSR, normal  Labs (2/14): K 3.9, creatinine 1.6, BNP 89, LDL 28, HDL 89 Labs (5/14): LDL 39, HDL 104 Labs (9/14): K 4.2, creatinine 1.7  PMH: 1. CKD 2. COPD: Coreg triggered wheezing.  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  5. Migraines 6. GERD 7. HTN 8. Hyperlipidemia 9. CAD: NSTEMI in 6/12 complicated by VT arrest.  BMS to proximal LAD with staged DES to mid LCx. Initial EF 20-25%.  Repeat echo in 6/12 post-PCI showed EF 55-60%, mild LVH. Echo (8/14) with EF 45-50%, basal inferior HK, mild LVH.  10. Diastolic CHF  SH: Widower with 2 children.  Lives in [place].  Works part time Psychologist, educational.  Prior tobacco.  Occasional ETOH.    FH: Mother with CAD.   ROS: All systems reviewed and negative except as per HPI.   Current Outpatient Prescriptions  Medication Sig Dispense Refill  . albuterol (PROAIR HFA) 108 (90 BASE) MCG/ACT inhaler Inhale 2 puffs into the lungs  every 6 (six) hours as needed for wheezing.  1 Inhaler  11  . amLODipine (NORVASC) 5 MG tablet TAKE ONE & ONE-HALF TABLETS BY MOUTH ONCE DAILY  45 tablet  3  . aspirin 81 MG tablet Take 81 mg by mouth daily.        . budesonide-formoterol (SYMBICORT) 160-4.5 MCG/ACT inhaler Inhale 2 puffs into the lungs 2 (two) times daily.  1 Inhaler  5  . Garlic 1000 MG CAPS Take 1,000 mg by mouth 2 (two) times daily.       . hydrochlorothiazide (HYDRODIURIL) 25 MG tablet Take 1 tablet (25 mg total) by mouth daily.  30 tablet  11  . lisinopril (PRINIVIL,ZESTRIL) 20 MG tablet Take 1 tablet (20 mg total) by mouth daily.  90 tablet  3  . metoprolol (LOPRESSOR) 50 MG tablet TAKE ONE TABLET BY MOUTH TWICE DAILY  60 tablet  5  . Omega-3 Fatty Acids (FISH OIL) 1000 MG CAPS Take 1,000 mg by mouth 2 (two) times daily.       Marland Kitchen omeprazole (PRILOSEC OTC) 20 MG tablet Take 20 mg by mouth every evening.       . STUDY MEDICATION Accelerate study      . topiramate (TOPAMAX) 200 MG tablet Take 200 mg by mouth as needed.      Marland Kitchen  atorvastatin (LIPITOR) 20 MG tablet Take 1 tablet (20 mg total) by mouth daily.  30 tablet  3   No current facility-administered medications for this visit.    BP 132/72  Pulse 61  Ht 5\' 8"  (1.727 m)  Wt 105.688 kg (233 lb)  BMI 35.44 kg/m2 General: NAD, obese.  Neck: No JVD, no thyromegaly or thyroid nodule.  Lungs: Distant breath sounds.  CV: Nondisplaced PMI.  Heart regular S1/S2, no S3/S4, no murmur.  Trace ankle edema.  No carotid bruit.    Abdomen: Soft, nontender, no hepatosplenomegaly, no distention.  Neurologic: Alert and oriented x 3.  Psych: Normal affect. Extremities: No clubbing or cyanosis.   Assessment/Plan: 1. CAD: No chest pain.  He will continue ASA 81, statin, ACEI, and metoprolol.  He was on Coreg but it triggered wheezing.  2. Hyperlipidemia: Excellent lipids in 5/14.  Continue current statin.  He is also on the Accelerate study drug. Crestor is expensive and is causing  myalgias.  I will have him try atorvastatin 20 mg daily with lipids/LFTs in 2 months.  3. CKD: Last creatinine 1.7 (stable compared to the past).  4. Obesity: I encouraged him to start walking for exercise.  I would like to see him lose 15 lbs over the next year.  5. Paroxysmal atrial fibrillation: He carries this diagnosis but has had no recent occurrence and no tachypalpitations.  At this time, he is not on anticoagulation.  If he has another episode, he should be anticoagulated.  6. Chronic diastolic CHF: He does not appear significantly volume overloaded on exam to me.  NYHA class II symptoms.  He did not tolerate Lasix well.  I think it would be reasonable to continue him on HCTZ.  He needs to cut back on sodium intake: eating out, beer, etc.  Check BMET/BNP today.   [name] 09/05/2013

## 2013-09-05 NOTE — Patient Instructions (Signed)
Stop crestor.   Start atorvastatin 20mg  daily.  Your physician recommends that you return for lab work today--BMET/BNP.   Your physician recommends that you return for a FASTING lipid profile /liver profile in 2 months.  Your physician recommends that you schedule a follow-up appointment in: 2 months with Dr Shirlee Latch.

## 2013-09-06 ENCOUNTER — Other Ambulatory Visit: Payer: Self-pay | Admitting: *Deleted

## 2013-09-06 DIAGNOSIS — N289 Disorder of kidney and ureter, unspecified: Secondary | ICD-10-CM

## 2013-09-06 DIAGNOSIS — I5032 Chronic diastolic (congestive) heart failure: Secondary | ICD-10-CM

## 2013-09-19 ENCOUNTER — Other Ambulatory Visit (INDEPENDENT_AMBULATORY_CARE_PROVIDER_SITE_OTHER): Payer: Medicare Other

## 2013-09-19 DIAGNOSIS — I5032 Chronic diastolic (congestive) heart failure: Secondary | ICD-10-CM

## 2013-09-19 DIAGNOSIS — N289 Disorder of kidney and ureter, unspecified: Secondary | ICD-10-CM

## 2013-09-19 LAB — BASIC METABOLIC PANEL
BUN: 18 mg/dL (ref 6–23)
Chloride: 107 mEq/L (ref 96–112)
Creatinine, Ser: 1.5 mg/dL (ref 0.4–1.5)
Glucose, Bld: 106 mg/dL — ABNORMAL HIGH (ref 70–99)
Potassium: 4.1 mEq/L (ref 3.5–5.1)

## 2013-11-06 ENCOUNTER — Other Ambulatory Visit: Payer: Medicare Other

## 2013-11-07 ENCOUNTER — Other Ambulatory Visit (INDEPENDENT_AMBULATORY_CARE_PROVIDER_SITE_OTHER): Payer: Medicare Other

## 2013-11-07 DIAGNOSIS — I10 Essential (primary) hypertension: Secondary | ICD-10-CM

## 2013-11-07 DIAGNOSIS — I2510 Atherosclerotic heart disease of native coronary artery without angina pectoris: Secondary | ICD-10-CM

## 2013-11-07 DIAGNOSIS — I4891 Unspecified atrial fibrillation: Secondary | ICD-10-CM

## 2013-11-07 LAB — HEPATIC FUNCTION PANEL
AST: 16 U/L (ref 0–37)
Alkaline Phosphatase: 53 U/L (ref 39–117)
Bilirubin, Direct: 0.1 mg/dL (ref 0.0–0.3)
Total Bilirubin: 0.8 mg/dL (ref 0.3–1.2)

## 2013-11-07 LAB — BASIC METABOLIC PANEL
BUN: 19 mg/dL (ref 6–23)
CO2: 28 mEq/L (ref 19–32)
Calcium: 8.8 mg/dL (ref 8.4–10.5)
Creatinine, Ser: 1.6 mg/dL — ABNORMAL HIGH (ref 0.4–1.5)
Glucose, Bld: 97 mg/dL (ref 70–99)

## 2013-11-07 LAB — LIPID PANEL: Total CHOL/HDL Ratio: 2

## 2013-11-08 ENCOUNTER — Encounter: Payer: Self-pay | Admitting: Cardiology

## 2013-11-08 ENCOUNTER — Ambulatory Visit (INDEPENDENT_AMBULATORY_CARE_PROVIDER_SITE_OTHER): Payer: Medicare Other | Admitting: Cardiology

## 2013-11-08 VITALS — BP 136/76 | HR 52 | Ht 68.0 in | Wt 234.0 lb

## 2013-11-08 DIAGNOSIS — J438 Other emphysema: Secondary | ICD-10-CM

## 2013-11-08 DIAGNOSIS — E785 Hyperlipidemia, unspecified: Secondary | ICD-10-CM

## 2013-11-08 DIAGNOSIS — I5032 Chronic diastolic (congestive) heart failure: Secondary | ICD-10-CM

## 2013-11-08 DIAGNOSIS — J439 Emphysema, unspecified: Secondary | ICD-10-CM

## 2013-11-08 DIAGNOSIS — I509 Heart failure, unspecified: Secondary | ICD-10-CM

## 2013-11-08 DIAGNOSIS — I2510 Atherosclerotic heart disease of native coronary artery without angina pectoris: Secondary | ICD-10-CM

## 2013-11-08 NOTE — Progress Notes (Signed)
Patient ID: [name], male   DOB: [birth year], 67 y.o.   MRN: [id number] PCP: Dr. Nathanial Rancher  68 yo with history of COPD and CAD s/p BMS to pLAD and DES to m LCx in 6/12 after NSTEMI presents for cardiology followup.  Since I last saw him, he saw the PA in this office with complaint of increased weight and exertional dyspnea.  He was thought to be volume overloaded and was started on Lasix 40 mg daily.  He developed severe leg cramps with Lasix despite potassium repletion, so he was switched to HCTZ which he has tolerated without problems. Weight is actually up about 6 lbs since last appointment.  He had an echo done in 8/14 showing EF 45-50% with basal inferior hypokinesis.    Currently, he is short of breath when he lifts a heavy load at work. He is short of breath if he rushes up a flight of steps.  He has no problems walking on flat ground.   No chest pain.  No orthopnea or PND.  He switched statins to atorvastatin and is not having any myalgias now. Weight is stable (1 lb up).     Labs (2/14): K 3.9, creatinine 1.6, BNP 89, LDL 28, HDL 89 Labs (5/14): LDL 39, HDL 104 Labs (9/14): K 4.2, creatinine 1.7 Labs (12/14): K 4.4, creatinine 1.6, LDL 39, HDL 80  PMH: 1. CKD 2. COPD: Coreg triggered wheezing.  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  5. Migraines 6. GERD 7. HTN 8. Hyperlipidemia: Myalgias with Crestor.  9. CAD: NSTEMI in 6/12 complicated by VT arrest.  BMS to proximal LAD with staged DES to mid LCx. Initial EF 20-25%.  Repeat echo in 6/12 post-PCI showed EF 55-60%, mild LVH. Echo (8/14) with EF 45-50%, basal inferior HK, mild LVH.  10. Diastolic CHF  SH: Widower with 2 children.  Lives in [place].  Works part time Psychologist, educational.  Prior tobacco.  Occasional ETOH.    FH: Mother with CAD.   Current Outpatient Prescriptions  Medication Sig Dispense Refill  . albuterol (PROAIR HFA) 108 (90 BASE) MCG/ACT inhaler Inhale 2 puffs into the lungs  every 6 (six) hours as needed for wheezing.  1 Inhaler  11  . amLODipine (NORVASC) 5 MG tablet TAKE ONE & ONE-HALF TABLETS BY MOUTH ONCE DAILY  45 tablet  3  . aspirin 81 MG tablet Take 81 mg by mouth daily.        Marland Kitchen atorvastatin (LIPITOR) 20 MG tablet Take 1 tablet (20 mg total) by mouth daily.  30 tablet  3  . budesonide-formoterol (SYMBICORT) 160-4.5 MCG/ACT inhaler Inhale 2 puffs into the lungs 2 (two) times daily.  1 Inhaler  5  . Garlic 1000 MG CAPS Take 1,000 mg by mouth 2 (two) times daily.       . hydrochlorothiazide (HYDRODIURIL) 25 MG tablet Take 1 tablet (25 mg total) by mouth daily.  30 tablet  11  . lisinopril (PRINIVIL,ZESTRIL) 20 MG tablet Take 1 tablet (20 mg total) by mouth daily.  90 tablet  3  . metoprolol (LOPRESSOR) 50 MG tablet TAKE ONE TABLET BY MOUTH TWICE DAILY  60 tablet  5  . Omega-3 Fatty Acids (FISH OIL) 1000 MG CAPS Take 1,000 mg by mouth 2 (two) times daily.       Marland Kitchen omeprazole (PRILOSEC OTC) 20 MG tablet Take 20 mg by mouth every evening.       . STUDY MEDICATION Accelerate  study      . topiramate (TOPAMAX) 200 MG tablet Take 200 mg by mouth as needed.       No current facility-administered medications for this visit.    BP 136/76  Pulse 52  Ht 5\' 8"  (1.727 m)  Wt 106.142 kg (234 lb)  BMI 35.59 kg/m2 General: NAD, obese.  Neck: No JVD, no thyromegaly or thyroid nodule.  Lungs: End expiratory wheezes  CV: Nondisplaced PMI.  Heart regular S1/S2, no S3/S4, no murmur.  1+ ankle edema.  No carotid bruit.   Abdomen: Soft, nontender, no hepatosplenomegaly, no distention.  Neurologic: Alert and oriented x 3.  Psych: Normal affect. Extremities: No clubbing or cyanosis.   Assessment/Plan: 1. CAD: No chest pain.  He will continue ASA 81, statin, ACEI, and metoprolol.  He was on Coreg but it triggered wheezing.  2. Hyperlipidemia: Excellent lipids in 12/14.  Continue current statin.  He is also on the Accelerate study drug. He is tolerating atorvastatin without  myalgias.  3. CKD: Last creatinine 1.6 (stable compared to the past).  4. Obesity: I encouraged him to start walking for exercise.  5. Paroxysmal atrial fibrillation: He carries this diagnosis but has had no recent occurrence and no tachypalpitations.  At this time, he is not on anticoagulation.  If he has another episode, he should be anticoagulated.  6. Chronic primarily diastolic CHF: EF 78-29% on last echo.  No significant volume overload on exam.  NYHA class II symptoms.    7. COPD: Patient seems on exam to have evidence for significant COPD.  He is no longer smoking and is using inhalers.  I think this plays a major role along with obesity in his dyspnea.   [name] 11/08/2013

## 2013-11-08 NOTE — Patient Instructions (Signed)
Your physician wants you to follow-up in: 6 months with Dr McLean. (June 2015).  You will receive a reminder letter in the mail two months in advance. If you don't receive a letter, please call our office to schedule the follow-up appointment.  

## 2013-12-10 ENCOUNTER — Other Ambulatory Visit: Payer: Self-pay | Admitting: Cardiology

## 2014-01-03 ENCOUNTER — Ambulatory Visit (INDEPENDENT_AMBULATORY_CARE_PROVIDER_SITE_OTHER): Payer: Medicare Other | Admitting: Pulmonary Disease

## 2014-01-03 ENCOUNTER — Encounter: Payer: Self-pay | Admitting: Pulmonary Disease

## 2014-01-03 VITALS — BP 120/92 | HR 58 | Temp 98.0°F | Ht 68.0 in | Wt 239.0 lb

## 2014-01-03 DIAGNOSIS — J439 Emphysema, unspecified: Secondary | ICD-10-CM

## 2014-01-03 DIAGNOSIS — J438 Other emphysema: Secondary | ICD-10-CM

## 2014-01-03 NOTE — Progress Notes (Signed)
   Subjective:    Patient ID: [name], male    DOB: 02/18/1946, 68 y.o.   MRN: [id number]  HPI The patient comes in today for followup of his known COPD. He is staying compliant on his bronchodilator regimen, and feels that his breathing is at baseline. He has not had an acute exacerbation or pulmonary infection since last visit. He has gained weight, and has not been working on any type of exercise program. He continues to have audible wheezing at times, and I have reminded him about his ACE inhibitor but I suspect is responsible for this.   Review of Systems  Constitutional: Positive for unexpected weight change. Negative for fever.  HENT: Negative for congestion, dental problem, ear pain, nosebleeds, postnasal drip, rhinorrhea, sinus pressure, sneezing, sore throat and trouble swallowing.   Eyes: Negative for redness and itching.  Respiratory: Positive for shortness of breath and wheezing. Negative for cough and chest tightness.   Cardiovascular: Negative for palpitations and leg swelling.  Gastrointestinal: Negative for nausea and vomiting.  Genitourinary: Negative for dysuria.  Musculoskeletal: Negative for joint swelling.  Skin: Negative for rash.  Neurological: Negative for headaches.  Hematological: Does not bruise/bleed easily.  Psychiatric/Behavioral: Negative for dysphoric mood. The patient is not nervous/anxious.        Objective:   Physical Exam Obese male in no acute distress Nose without purulence or discharge noted Neck without lymphadenopathy or thyromegaly Chest with decreased breath sounds, no wheezing. There is upper airway pseudo wheezing noted at end exhalation Cardiac exam with regular rate and rhythm Lower extremities with mild ankle edema, no cyanosis Alert and oriented, moves all 4 extremities.        Assessment & Plan:

## 2014-01-03 NOTE — Assessment & Plan Note (Signed)
The patient appears to be stable from a COPD standpoint, but I have stressed to him the importance of aggressive weight loss and conditioning. He also continues to have classic upper airway pseudo wheezing, probably related to his ACE inhibitor. I have asked him to stay on his current inhaler regimen, and to work harder on weight loss and some type of exercise program.

## 2014-01-03 NOTE — Patient Instructions (Signed)
No change in breathing medications. Work on weight loss and conditioning followup with me again in 6mos.

## 2014-01-07 ENCOUNTER — Other Ambulatory Visit: Payer: Self-pay | Admitting: Cardiology

## 2014-01-22 ENCOUNTER — Telehealth: Payer: Self-pay | Admitting: *Deleted

## 2014-01-22 NOTE — Telephone Encounter (Signed)
Patient had abnormal research labs on 01/10/14 with K+ 5.7 and creatinine 1.58. Dr. Riley KillStuckey asked me to repeat labs which were drawn on 01/17/14. K+ was 5.1 and creatinine was 1.35. Patient was notified by phone on 01/22/14 that labs were within normal range.

## 2014-02-19 ENCOUNTER — Other Ambulatory Visit: Payer: Self-pay | Admitting: Cardiology

## 2014-03-01 ENCOUNTER — Encounter: Payer: Self-pay | Admitting: Cardiology

## 2014-04-30 ENCOUNTER — Telehealth: Payer: Self-pay | Admitting: Pulmonary Disease

## 2014-04-30 NOTE — Telephone Encounter (Signed)
I called spoke with pt son. He was needing a refill on lisinopril. I advised him pt cardiologists refills this not [place]. He will contact them. Nothing further needed

## 2014-05-02 ENCOUNTER — Other Ambulatory Visit: Payer: Self-pay

## 2014-05-02 MED ORDER — LISINOPRIL 20 MG PO TABS: 20.0000 mg | ORAL_TABLET | Freq: Every day | ORAL | Status: DC

## 2014-06-25 ENCOUNTER — Other Ambulatory Visit: Payer: Self-pay | Admitting: Cardiology

## 2014-07-03 ENCOUNTER — Ambulatory Visit: Payer: Medicare Other | Admitting: Pulmonary Disease

## 2014-07-09 ENCOUNTER — Emergency Department (HOSPITAL_COMMUNITY)
Admission: EM | Admit: 2014-07-09 | Discharge: 2014-07-10 | Disposition: A | Discharge status: Home / Self Care | Payer: Medicare Other | Attending: Emergency Medicine | Admitting: Emergency Medicine

## 2014-07-09 ENCOUNTER — Encounter (HOSPITAL_COMMUNITY): Payer: Self-pay | Admitting: Emergency Medicine

## 2014-07-09 ENCOUNTER — Other Ambulatory Visit: Payer: Self-pay | Admitting: Cardiology

## 2014-07-09 DIAGNOSIS — R51 Headache: Secondary | ICD-10-CM | POA: Diagnosis not present

## 2014-07-09 DIAGNOSIS — K219 Gastro-esophageal reflux disease without esophagitis: Secondary | ICD-10-CM | POA: Insufficient documentation

## 2014-07-09 DIAGNOSIS — I10 Essential (primary) hypertension: Secondary | ICD-10-CM | POA: Diagnosis not present

## 2014-07-09 DIAGNOSIS — I498 Other specified cardiac arrhythmias: Secondary | ICD-10-CM | POA: Diagnosis not present

## 2014-07-09 DIAGNOSIS — E785 Hyperlipidemia, unspecified: Secondary | ICD-10-CM | POA: Insufficient documentation

## 2014-07-09 DIAGNOSIS — Z7982 Long term (current) use of aspirin: Secondary | ICD-10-CM | POA: Diagnosis not present

## 2014-07-09 DIAGNOSIS — IMO0002 Reserved for concepts with insufficient information to code with codable children: Secondary | ICD-10-CM | POA: Diagnosis not present

## 2014-07-09 DIAGNOSIS — I2510 Atherosclerotic heart disease of native coronary artery without angina pectoris: Secondary | ICD-10-CM | POA: Diagnosis not present

## 2014-07-09 DIAGNOSIS — Z79899 Other long term (current) drug therapy: Secondary | ICD-10-CM | POA: Diagnosis not present

## 2014-07-09 DIAGNOSIS — R509 Fever, unspecified: Secondary | ICD-10-CM | POA: Insufficient documentation

## 2014-07-09 DIAGNOSIS — Z9861 Coronary angioplasty status: Secondary | ICD-10-CM | POA: Insufficient documentation

## 2014-07-09 DIAGNOSIS — Z87891 Personal history of nicotine dependence: Secondary | ICD-10-CM | POA: Insufficient documentation

## 2014-07-09 DIAGNOSIS — R630 Anorexia: Secondary | ICD-10-CM | POA: Diagnosis not present

## 2014-07-09 DIAGNOSIS — R42 Dizziness and giddiness: Secondary | ICD-10-CM | POA: Diagnosis not present

## 2014-07-09 DIAGNOSIS — J40 Bronchitis, not specified as acute or chronic: Secondary | ICD-10-CM

## 2014-07-09 DIAGNOSIS — R5383 Other fatigue: Secondary | ICD-10-CM | POA: Diagnosis not present

## 2014-07-09 DIAGNOSIS — I4891 Unspecified atrial fibrillation: Secondary | ICD-10-CM | POA: Insufficient documentation

## 2014-07-09 DIAGNOSIS — R5381 Other malaise: Secondary | ICD-10-CM | POA: Diagnosis not present

## 2014-07-09 DIAGNOSIS — J441 Chronic obstructive pulmonary disease with (acute) exacerbation: Secondary | ICD-10-CM | POA: Insufficient documentation

## 2014-07-09 LAB — BASIC METABOLIC PANEL
ANION GAP: 12 (ref 5–15)
BUN: 26 mg/dL — ABNORMAL HIGH (ref 6–23)
CHLORIDE: 95 meq/L — AB (ref 96–112)
CO2: 25 mEq/L (ref 19–32)
Calcium: 9.4 mg/dL (ref 8.4–10.5)
Creatinine, Ser: 1.52 mg/dL — ABNORMAL HIGH (ref 0.50–1.35)
GFR calc Af Amer: 53 mL/min — ABNORMAL LOW (ref >90)
GFR calc non Af Amer: 45 mL/min — ABNORMAL LOW (ref >90)
GLUCOSE: 107 mg/dL — AB (ref 70–99)
POTASSIUM: 4.2 meq/L (ref 3.7–5.3)
SODIUM: 132 meq/L — AB (ref 137–147)

## 2014-07-09 LAB — CBC
HCT: 43.4 % (ref 39.0–52.0)
HEMOGLOBIN: 15.2 g/dL (ref 13.0–17.0)
MCH: 30.5 pg (ref 26.0–34.0)
MCHC: 35 g/dL (ref 30.0–36.0)
MCV: 87.1 fL (ref 78.0–100.0)
PLATELETS: 230 10*3/uL (ref 150–400)
RBC: 4.98 MIL/uL (ref 4.22–5.81)
RDW: 12.4 % (ref 11.5–15.5)
WBC: 9.1 10*3/uL (ref 4.0–10.5)

## 2014-07-09 MED ORDER — IPRATROPIUM-ALBUTEROL 0.5-2.5 (3) MG/3ML IN SOLN
3.0000 mL | Freq: Once | RESPIRATORY_TRACT | Status: AC
  Administered 2014-07-09: 3 mL via RESPIRATORY_TRACT
  Filled 2014-07-09: qty 3

## 2014-07-09 NOTE — ED Provider Notes (Signed)
CSN: [id number]     Arrival date & time 07/09/14  1929 History   First MD Initiated Contact with Patient 07/09/14 2324     Chief Complaint  Patient presents with  . Fever     (Consider location/radiation/quality/duration/timing/severity/associated sxs/prior Treatment) HPI Comments: Patient's son reports one week history of feeling sick with cough, congestion, wheezing and intermittent headaches. He was seen by urgent care last week as well as [facility] and treated for bronchitis with Zithromax. He has one day left of antibiotics. Reported feeling weak today. One episode of vomiting this morning. Currently denies any chest pain, headache, abdominal pain. No diarrhea. Cough is productive of yellow mucus. Denies any sore throat runny nose. He has a history of COPD, atrial fibrillation, CAD with stents. Denies any chest pain. Does not feel any more short of breath than at baseline.  The history is provided by the patient and a relative.    Past Medical History  Diagnosis Date  . COPD (chronic obstructive pulmonary disease)     ?  Marland Kitchen. Paroxysmal atrial fibrillation   . 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 Ladona Ridgelaylor 6/10  . Syncope and collapse   . History of migraines   . GERD (gastroesophageal reflux disease)   . Dyspnea   . Hypertension   . Allergic rhinitis   . HLD (hyperlipidemia)   . CAD (coronary artery disease)     a. s/p NSTEMI 6/12: c/b VT/VF arrest req. defib and amio IV rx., pRCA occluded at cath and treated with BMS;  staged PCI of mCFX 90% on 04/28/11 with Promus DES;  residual CAD at cath 6/12: pLAD 20%, pCFX 40%, oOM1 70%, oRI 60-70%, EF 20-25%;   follow up Echo 6/12 with recovered EF:  EF 55-60%, mild LVH, mod LAE  . Heart attack 04/25/2011   Past Surgical History  Procedure Laterality Date  . Knee surgery  1971    Secondary to injury  . Cardiac electrophysiology study and ablation  05/22/09    Radiofrequency catheter ablation of AV  reentrant tachycardia; Dr. Lewayne BuntingGregg Taylor  . Heart stent placement  04/25/2011   Family History  Problem Relation Age of Onset  . Hypertension Mother   . Coronary artery disease Mother   . Diabetes Mother   . Heart disease Father   . Rheum arthritis Father   . Breast cancer Paternal Aunt    History  Substance Use Topics  . Smoking status: Former Smoker -- 2.00 packs/day for 50 years    Types: Cigarettes    Quit date: 11/24/1999  . Smokeless tobacco: Never Used  . Alcohol Use: Yes     Comment: Occasional beer drinker and had moonshine 6 months ago.    Review of Systems  Constitutional: Positive for fever, chills, activity change, appetite change and fatigue.  HENT: Positive for congestion and rhinorrhea.   Eyes: Negative for visual disturbance.  Respiratory: Positive for shortness of breath. Negative for cough.   Cardiovascular: Negative for chest pain.  Gastrointestinal: Negative for nausea, vomiting and abdominal pain.  Genitourinary: Negative for dysuria and hematuria.  Musculoskeletal: Negative for arthralgias and myalgias.  Skin: Negative for wound.  Neurological: Positive for dizziness, weakness and light-headedness. Negative for headaches.  A complete 10 system review of systems was obtained and all systems are negative except as noted in the HPI and PMH.      Allergies  Carvedilol and Codeine  Home Medications   Prior to Admission medications  Medication Sig Start Date End Date Taking? Authorizing Provider  albuterol (PROAIR HFA) 108 (90 BASE) MCG/ACT inhaler Inhale 2 puffs into the lungs every 6 (six) hours as needed for wheezing. 06/23/12   Barbaraann Share, MD  albuterol (PROVENTIL HFA;VENTOLIN HFA) 108 (90 BASE) MCG/ACT inhaler Inhale 1-2 puffs into the lungs every 6 (six) hours as needed for wheezing or shortness of breath. 07/10/14   Glynn Octave, MD  amLODipine (NORVASC) 5 MG tablet TAKE ONE & ONE-HALF TABLETS BY MOUTH ONCE DAILY 12/10/13   Laurey Morale, MD   aspirin 81 MG tablet Take 81 mg by mouth daily.      Historical Provider, MD  atorvastatin (LIPITOR) 20 MG tablet TAKE ONE TABLET BY MOUTH ONCE DAILY    Laurey Morale, MD  budesonide-formoterol [facility]) 160-4.5 MCG/ACT inhaler Inhale 2 puffs into the lungs 2 (two) times daily. 01/13/13 01/13/14  Barbaraann Share, MD  Garlic 1000 MG CAPS Take 1,000 mg by mouth 2 (two) times daily.     Historical Provider, MD  hydrochlorothiazide (HYDRODIURIL) 25 MG tablet Take 1 tablet (25 mg total) by mouth daily. 07/26/13   Dyann Kief, PA-C  lisinopril (PRINIVIL,ZESTRIL) 20 MG tablet Take 1 tablet (20 mg total) by mouth daily. 05/02/14   Laurey Morale, MD  metoprolol (LOPRESSOR) 50 MG tablet TAKE ONE TABLET BY MOUTH TWICE DAILY 12/10/13   Laurey Morale, MD  Omega-3 Fatty Acids (FISH OIL) 1000 MG CAPS Take 1,000 mg by mouth 2 (two) times daily.     Historical Provider, MD  omeprazole (PRILOSEC OTC) 20 MG tablet Take 20 mg by mouth every evening.     Historical Provider, MD  predniSONE (DELTASONE) 50 MG tablet 1 tablet PO daily 07/10/14   Glynn Octave, MD  STUDY MEDICATION Accelerate study    Historical Provider, MD  topiramate (TOPAMAX) 200 MG tablet Take 200 mg by mouth as needed.    Historical Provider, MD   BP 151/80  Pulse 93  Temp(Src) 99.5 F (37.5 C) (Oral)  Resp 22  Ht 5\' 8"  (1.727 m)  Wt 227 lb 8 oz (103.193 kg)  BMI 34.60 kg/m2  SpO2 92% Physical Exam  Nursing note and vitals reviewed. Constitutional: He is oriented to person, place, and time. He appears well-developed and well-nourished. No distress.  HENT:  Head: Normocephalic and atraumatic.  Mouth/Throat: Oropharynx is clear and moist. No oropharyngeal exudate.  Eyes: Conjunctivae and EOM are normal. Pupils are equal, round, and reactive to light.  Neck: Normal range of motion. Neck supple.  No meningismus.  Cardiovascular: Normal rate, regular rhythm, normal heart sounds and intact distal pulses.   No murmur  heard. Pulmonary/Chest: Effort normal. No respiratory distress. He has wheezes.  Scattered Expiratory wheezing  Abdominal: Soft. There is no tenderness. There is no rebound and no guarding.  Musculoskeletal: Normal range of motion. He exhibits no edema and no tenderness.  Neurological: He is alert and oriented to person, place, and time. No cranial nerve deficit. He exhibits normal muscle tone. Coordination normal.  No ataxia on finger to nose bilaterally. No pronator drift. 5/5 strength throughout. CN 2-12 intact. Negative Romberg. Equal grip strength. Sensation intact. Gait is normal.   Skin: Skin is warm.  Psychiatric: He has a normal mood and affect. His behavior is normal.    ED Course  Procedures (including critical care time) Labs Review Labs Reviewed  BASIC METABOLIC PANEL - Abnormal; Notable for the following:    Sodium 132 (*)  Chloride 95 (*)    Glucose, Bld 107 (*)    BUN 26 (*)    Creatinine, Ser 1.52 (*)    GFR calc non Af Amer 45 (*)    GFR calc Af Amer 53 (*)    All other components within normal limits  PRO B NATRIURETIC PEPTIDE - Abnormal; Notable for the following:    Pro B Natriuretic peptide (BNP) 809.1 (*)    All other components within normal limits  URINALYSIS, ROUTINE W REFLEX MICROSCOPIC - Abnormal; Notable for the following:    Color, Urine AMBER (*)    Specific Gravity, Urine 1.031 (*)    Bilirubin Urine SMALL (*)    Ketones, ur 15 (*)    Protein, ur 30 (*)    All other components within normal limits  D-DIMER, QUANTITATIVE - Abnormal; Notable for the following:    D-Dimer, Quant 1.19 (*)    All other components within normal limits  CBC  TROPONIN I  URINE MICROSCOPIC-ADD ON  TROPONIN I    Imaging Review Dg Chest 2 View  07/10/2014   CLINICAL DATA:  Fever  EXAM: CHEST  2 VIEW  COMPARISON:  07/06/2014  FINDINGS: Normal heart size and upper mediastinal contours. Coronary stent noted. There is mild chronic interstitial coarsening but no edema or  consolidation. No effusion or pneumothorax.  IMPRESSION: No active cardiopulmonary disease.   Electronically Signed   By: Tiburcio Pea M.D.   On: 07/10/2014 00:28   Ct Head Wo Contrast  07/10/2014   CLINICAL DATA:  Dizziness.  EXAM: CT HEAD WITHOUT CONTRAST  TECHNIQUE: Contiguous axial images were obtained from the base of the skull through the vertex without intravenous contrast.  COMPARISON:  None.  FINDINGS: Scattered and confluent hypodensity within the periventricular and deep white matter both cerebral hemispheres is most compatible with moderate chronic small vessel ischemic disease.  There is no acute intracranial hemorrhage or infarct. No mass lesion or midline shift. Gray-white matter differentiation is well maintained. Ventricles are normal in size without evidence of hydrocephalus. CSF containing spaces are within normal limits. No extra-axial fluid collection.  The calvarium is intact.  Orbital soft tissues are within normal limits.  The paranasal sinuses and mastoid air cells are well pneumatized and free of fluid.  Scalp soft tissues are unremarkable.  IMPRESSION: 1. No acute intracranial process. 2. Moderate chronic small vessel ischemic disease.   Electronically Signed   By: Rise Mu M.D.   On: 07/10/2014 02:38   Ct Angio Chest Pe W/cm &/or Wo Cm  07/10/2014   CLINICAL DATA:  Shortness of breath  EXAM: CT ANGIOGRAPHY CHEST WITH CONTRAST  TECHNIQUE: Multidetector CT imaging of the chest was performed using the standard protocol during bolus administration of intravenous contrast. Multiplanar CT image reconstructions and MIPs were obtained to evaluate the vascular anatomy.  CONTRAST:  59mL OMNIPAQUE IOHEXOL 350 MG/ML SOLN  COMPARISON:  Prior radiograph from earlier the same day  FINDINGS: The visualized thyroid gland is within normal limits.  A mildly enlarged right hilar lymph node measures 1.1 cm in short axis. No other pathologically enlarged mediastinal, hilar, or axillary  lymph nodes are identified.  Intrathoracic aorta is of normal caliber. Moderate calcified and noncalcified atheromatous disease present within the aortic arch. No high-grade stenosis seen at the origin of the great vessels.  Heart is mildly enlarged with prominent diffuse 3 vessel coronary artery calcifications. No pericardial effusion.  Pulmonary arterial tree is well opacified. No filling defect to suggest acute  pulmonary embolism identified. Evaluation of distal segmental arteries is somewhat limited due to respiratory motion artifact. Re-formatted imaging confirms these findings.  The lungs are clear without focal infiltrate or pulmonary edema. Mild subsegmental atelectasis seen dependently within the right lung base. No pleural effusion. No pneumothorax.  Calcified granuloma noted within the posterior right upper lobe adjacent to the right major fissure. A few subcentimeter pulmonary nodules are seen clustered next to each other in the right upper lobe, measuring up to 4-5 mm (series 6, image 39 37, 39). These are indeterminate. No other pulmonary nodule or mass identified.  Small hiatal hernia noted. Visualized upper abdomen otherwise unremarkable.  IMPRESSION: 1. No CTA evidence of acute pulmonary embolism. 2. Few subcentimeter pulmonary nodules measuring up to 4-5 mm clustered together in the perihilar right upper lobe. These are indeterminate, but may represent a small focal pneumonitis. A short interval follow-up scan in 6 months could be considered to ensure resolution/stability of these findings. 3. Mildly prominent 1.1 cm right hilar lymph node, which may be reactive in nature. 4. Moderate atheromatous disease within the intrathoracic aorta with diffuse 3 vessel coronary artery calcifications. 5. Small hiatal hernia.   Electronically Signed   By: Rise Mu M.D.   On: 07/10/2014 04:46     EKG Interpretation   Date/Time:  Monday July 09 2014 20:24:42 EDT Ventricular Rate:  84 PR  Interval:  160 QRS Duration: 92 QT Interval:  354 QTC Calculation: 418 R Axis:   48 Text Interpretation:  Sinus rhythm with occasional Premature ventricular  complexes Otherwise normal ECG No significant change was found Confirmed  by Manus Gunning  MD, Astaria Nanez [phone]) on 07/09/2014 11:54:02 PM      MDM   Final diagnoses:  Bronchitis   patient reports generalized weakness with nausea and feeling ill for the past week. Recently treated for bronchitis with azithromycin. Endorses cough body aches, congestion and chills. Some lightheadedness with position change.  nonfocal neuro exam.  Chest x-ray negative. EKG unchanged. Patient no distress. No hypoxia or increased work of breathing.  Orthostatics positive by heart rate. Patient able to ambulate without desaturation. Labs unremarkable. Troponin negative.  Suspect bronchitis. Patient has one more day of azithromycin to finish. No focal neurological deficits. Is able to ambulate without difficulty.  Troponin negative x2. D-dimer positive. No evidence of PE but there are lung nodules which patient is informed of. Patient ambulatory without desaturation.  Continue to treat bronchitis with azithromycin. We'll add prednisone. Will refill with inhaler.  Follow up with PCP.  Return precautions discussed.  Glynn Octave, MD 07/10/14 [phone]

## 2014-07-09 NOTE — ED Notes (Signed)
Pt monitored by pulse ox, bp cuff, and 5-lead. 

## 2014-07-09 NOTE — ED Notes (Signed)
Pt reports feeling sick since 2 days ago. Reports nv, dizziness. Pt reports headaches the past 2 days as well but none today. Pt has been taking antibiotics for congestion since Thursday- sts he has one dose left. Reports congestion is improved.

## 2014-07-10 ENCOUNTER — Emergency Department (HOSPITAL_COMMUNITY): Payer: Medicare Other

## 2014-07-10 ENCOUNTER — Encounter (HOSPITAL_COMMUNITY): Payer: Self-pay | Admitting: Radiology

## 2014-07-10 LAB — URINE MICROSCOPIC-ADD ON

## 2014-07-10 LAB — URINALYSIS, ROUTINE W REFLEX MICROSCOPIC
GLUCOSE, UA: NEGATIVE mg/dL
Hgb urine dipstick: NEGATIVE
Ketones, ur: 15 mg/dL — AB
Leukocytes, UA: NEGATIVE
Nitrite: NEGATIVE
Protein, ur: 30 mg/dL — AB
SPECIFIC GRAVITY, URINE: 1.031 — AB (ref 1.005–1.030)
Urobilinogen, UA: 0.2 mg/dL (ref 0.0–1.0)
pH: 6.5 (ref 5.0–8.0)

## 2014-07-10 LAB — TROPONIN I
Troponin I: <0.3 ng/mL (ref <0.30)
Troponin I: <0.3 ng/mL (ref <0.30)

## 2014-07-10 LAB — PRO B NATRIURETIC PEPTIDE: Pro B Natriuretic peptide (BNP): 809.1 pg/mL — ABNORMAL HIGH (ref 0–125)

## 2014-07-10 LAB — D-DIMER, QUANTITATIVE: D-Dimer, Quant: 1.19 ug/mL-FEU — ABNORMAL HIGH (ref 0.00–0.48)

## 2014-07-10 MED ORDER — PREDNISONE 50 MG PO TABS: ORAL_TABLET | ORAL | Status: DC

## 2014-07-10 MED ORDER — ALBUTEROL SULFATE HFA 108 (90 BASE) MCG/ACT IN AERS: 1.0000 | INHALATION_SPRAY | Freq: Four times a day (QID) | RESPIRATORY_TRACT | Status: AC | PRN

## 2014-07-10 MED ORDER — SODIUM CHLORIDE 0.9 % IV BOLUS (SEPSIS)
1000.0000 mL | Freq: Once | INTRAVENOUS | Status: AC
  Administered 2014-07-10: 1000 mL via INTRAVENOUS

## 2014-07-10 MED ORDER — IOHEXOL 350 MG/ML SOLN
80.0000 mL | Freq: Once | INTRAVENOUS | Status: AC | PRN
  Administered 2014-07-10: 59 mL via INTRAVENOUS

## 2014-07-10 NOTE — ED Notes (Addendum)
PT ambulated down hallway with pulse ox. Pulse jumped up to 136bpm and pt desated to 90%. Pt reported dizziness but held a steady gait.

## 2014-07-10 NOTE — Discharge Instructions (Signed)
Acute Bronchitis Follow up with your doctor. You need to have a repeat CT scan of your chest in 6 months. Return to the ED if you develop worsening chest pain, shortness of breath or any other concerns Bronchitis is inflammation of the airways that extend from the windpipe into the lungs (bronchi). The inflammation often causes mucus to develop. This leads to a cough, which is the most common symptom of bronchitis.  In acute bronchitis, the condition usually develops suddenly and goes away over time, usually in a couple weeks. Smoking, allergies, and asthma can make bronchitis worse. Repeated episodes of bronchitis may cause further lung problems.  CAUSES Acute bronchitis is most often caused by the same virus that causes a cold. The virus can spread from person to person (contagious) through coughing, sneezing, and touching contaminated objects. SIGNS AND SYMPTOMS   Cough.   Fever.   Coughing up mucus.   Body aches.   Chest congestion.   Chills.   Shortness of breath.   Sore throat.  DIAGNOSIS  Acute bronchitis is usually diagnosed through a physical exam. Your health care provider will also ask you questions about your medical history. Tests, such as chest X-rays, are sometimes done to rule out other conditions.  TREATMENT  Acute bronchitis usually goes away in a couple weeks. Oftentimes, no medical treatment is necessary. Medicines are sometimes given for relief of fever or cough. Antibiotic medicines are usually not needed but may be prescribed in certain situations. In some cases, an inhaler may be recommended to help reduce shortness of breath and control the cough. A cool mist vaporizer may also be used to help thin bronchial secretions and make it easier to clear the chest.  HOME CARE INSTRUCTIONS  Get plenty of rest.   Drink enough fluids to keep your urine clear or pale yellow (unless you have a medical condition that requires fluid restriction). Increasing fluids  may help thin your respiratory secretions (sputum) and reduce chest congestion, and it will prevent dehydration.   Take medicines only as directed by your health care provider.  If you were prescribed an antibiotic medicine, finish it all even if you start to feel better.  Avoid smoking and secondhand smoke. Exposure to cigarette smoke or irritating chemicals will make bronchitis worse. If you are a smoker, consider using nicotine gum or skin patches to help control withdrawal symptoms. Quitting smoking will help your lungs heal faster.   Reduce the chances of another bout of acute bronchitis by washing your hands frequently, avoiding people with cold symptoms, and trying not to touch your hands to your mouth, nose, or eyes.   Keep all follow-up visits as directed by your health care provider.  SEEK MEDICAL CARE IF: Your symptoms do not improve after 1 week of treatment.  SEEK IMMEDIATE MEDICAL CARE IF:  You develop an increased fever or chills.   You have chest pain.   You have severe shortness of breath.  You have bloody sputum.   You develop dehydration.  You faint or repeatedly feel like you are going to pass out.  You develop repeated vomiting.  You develop a severe headache. MAKE SURE YOU:   Understand these instructions.  Will watch your condition.  Will get help right away if you are not doing well or get worse. Document Released: 12/17/2004 Document Revised: 03/26/2014 Document Reviewed: 05/02/2013 [facility] Patient Information 2015 [employer] [place]. This information is not intended to replace advice given to you by your health care provider. Make  sure you discuss any questions you have with your health care provider.

## 2014-07-10 NOTE — ED Notes (Signed)
Hold fluids per order from Dr. Manus Gunningancour.

## 2014-07-13 ENCOUNTER — Other Ambulatory Visit: Payer: Self-pay | Admitting: Cardiology

## 2014-07-20 ENCOUNTER — Encounter: Payer: Self-pay | Admitting: Internal Medicine

## 2014-07-27 ENCOUNTER — Other Ambulatory Visit: Payer: Self-pay | Admitting: Cardiology

## 2014-08-09 ENCOUNTER — Other Ambulatory Visit: Payer: Self-pay | Admitting: *Deleted

## 2014-08-10 ENCOUNTER — Other Ambulatory Visit: Payer: Self-pay | Admitting: Cardiology

## 2014-08-10 ENCOUNTER — Encounter: Payer: Self-pay | Admitting: Cardiology

## 2014-08-10 ENCOUNTER — Ambulatory Visit (INDEPENDENT_AMBULATORY_CARE_PROVIDER_SITE_OTHER): Payer: Medicare Other | Admitting: Cardiology

## 2014-08-10 VITALS — BP 134/78 | HR 76 | Ht 68.0 in | Wt 233.0 lb

## 2014-08-10 DIAGNOSIS — I4891 Unspecified atrial fibrillation: Secondary | ICD-10-CM

## 2014-08-10 DIAGNOSIS — E785 Hyperlipidemia, unspecified: Secondary | ICD-10-CM

## 2014-08-10 DIAGNOSIS — I10 Essential (primary) hypertension: Secondary | ICD-10-CM

## 2014-08-10 DIAGNOSIS — I5032 Chronic diastolic (congestive) heart failure: Secondary | ICD-10-CM

## 2014-08-10 DIAGNOSIS — I2510 Atherosclerotic heart disease of native coronary artery without angina pectoris: Secondary | ICD-10-CM

## 2014-08-10 DIAGNOSIS — I509 Heart failure, unspecified: Secondary | ICD-10-CM

## 2014-08-10 DIAGNOSIS — I480 Paroxysmal atrial fibrillation: Secondary | ICD-10-CM

## 2014-08-10 MED ORDER — FUROSEMIDE 20 MG PO TABS: 20.0000 mg | ORAL_TABLET | Freq: Every day | ORAL | Status: DC

## 2014-08-10 MED ORDER — AMLODIPINE BESYLATE 10 MG PO TABS: 10.0000 mg | ORAL_TABLET | Freq: Every day | ORAL | Status: DC

## 2014-08-10 NOTE — Patient Instructions (Signed)
Stop HCTZ(hydrochlorothiazide).   Start lasix (furosemide)  daily.   Increase amlodipine to  daily.   Your physician recommends that you return for a FASTING lipid profile /BMET/BNP in 2 weeks.   Your physician recommends that you schedule a follow-up appointment in: 2 months with Dr Shirlee Latch.

## 2014-08-12 NOTE — Progress Notes (Signed)
Patient ID: [name], male   DOB: 1946/05/21, 68 y.o.   MRN: [id number] PCP: Dr. Nathanial Rancher  68 yo with history of COPD, CAD s/p BMS to pLAD and DES to m LCx in 6/12 after NSTEMI and diastolic CHF presents for cardiology followup.  He had an echo done in 8/14 showing EF 45-50% with basal inferior hypokinesis.  He is still working part-time with heating and AC.  He is short of breath walking up steps or walking in [employer].  This is chronic. He can walk around his house without problems.  No chest pain, orthopnea, PND.  Weight stable.   Labs (2/14): K 3.9, creatinine 1.6, BNP 89, LDL 28, HDL 89 Labs (5/14): LDL 39, HDL 104 Labs (9/14): K 4.2, creatinine 1.7 Labs (12/14): K 4.4, creatinine 1.6, LDL 39, HDL 80 Labs (8/15): K 4.2, creatinine 1.52, BNP 809  PMH: 1. CKD 2. COPD: Coreg triggered wheezing.  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  5. Migraines 6. GERD 7. HTN 8. Hyperlipidemia: Myalgias with Crestor.  9. CAD: NSTEMI in 6/12 complicated by VT arrest.  BMS to proximal LAD with staged DES to mid LCx. Initial EF 20-25%.  Repeat echo in 6/12 post-PCI showed EF 55-60%, mild LVH. Echo (8/14) with EF 45-50%, basal inferior HK, mild LVH.  10. Diastolic CHF  SH: Widower with 2 children.  Lives in [place].  Works part time Psychologist, educational.  Prior tobacco.  Occasional ETOH.    FH: Mother with CAD.   ROS: All systems reviewed and negative except as per HPI.   Current Outpatient Prescriptions  Medication Sig Dispense Refill  . albuterol (PROVENTIL HFA;VENTOLIN HFA) 108 (90 BASE) MCG/ACT inhaler Inhale 1-2 puffs into the lungs every 6 (six) hours as needed for wheezing or shortness of breath.  1 Inhaler  0  . aspirin 81 MG tablet Take 81 mg by mouth daily.        Marland Kitchen atorvastatin (LIPITOR) 20 MG tablet TAKE ONE TABLET BY MOUTH ONCE DAILY  30 tablet  0  . budesonide-formoterol (SYMBICORT) 160-4.5 MCG/ACT inhaler Inhale 2 puffs into the lungs 2 (two) times  daily.  1 Inhaler  5  . Garlic 1000 MG CAPS Take 1,000 mg by mouth 2 (two) times daily.       Marland Kitchen lisinopril (PRINIVIL,ZESTRIL) 20 MG tablet Take 1 tablet (20 mg total) by mouth daily.  90 tablet  3  . Omega-3 Fatty Acids (FISH OIL) 1000 MG CAPS Take 1,000 mg by mouth 2 (two) times daily.       Marland Kitchen omeprazole (PRILOSEC OTC) 20 MG tablet Take 20 mg by mouth every evening.       . predniSONE (DELTASONE) 50 MG tablet Take 50 mg by mouth daily with breakfast.      . STUDY MEDICATION Accelerate study      . topiramate (TOPAMAX) 200 MG tablet Take 200 mg by mouth as needed.      Marland Kitchen amLODipine (NORVASC) 10 MG tablet Take 1 tablet (10 mg total) by mouth daily.  30 tablet  3  . furosemide (LASIX) 20 MG tablet Take 1 tablet (20 mg total) by mouth daily.  30 tablet  3  . metoprolol (LOPRESSOR) 50 MG tablet TAKE ONE TABLET BY MOUTH TWICE DAILY  60 tablet  0   No current facility-administered medications for this visit.    BP 134/78  Pulse 76  Ht  (1.727 m)  Wt 233 lb (105.688 kg)  BMI 35.44 kg/m2  SpO2 96% General: NAD, obese.  Neck: JVP 8 cm, no thyromegaly or thyroid nodule.  Lungs: Mild end expiratory wheezes  CV: Nondisplaced PMI.  Heart regular S1/S2, no S3/S4, no murmur.  1+ edema 1/3 up lower legs bilaterally.  No carotid bruit.   Abdomen: Soft, nontender, no hepatosplenomegaly, no distention.  Neurologic: Alert and oriented x 3.  Psych: Normal affect. Extremities: No clubbing or cyanosis.   Assessment/Plan: 1. CAD: No chest pain.  He will continue ASA 81, statin, ACEI, and metoprolol.  He was on Coreg but it triggered wheezing.  2. Hyperlipidemia:  Continue current statin.  I am going to check his lipids with next labs. 3. CKD: Last creatinine 1.5 (stable compared to the past).  4. Obesity: I encouraged him to start walking for exercise.  5. Paroxysmal atrial fibrillation: He carries this diagnosis but has had no recent occurrence and no tachypalpitations.  At this time, he is not on  anticoagulation.  If he has another episode, he should be anticoagulated.  6. Chronic primarily diastolic CHF: EF 96-04% on last echo.  He has mild volume overload on exam.  I will have him stop HCTZ and start Lasix 20 mg daily with BMET/BNP in 2 wks.  7. COPD: Patient seems on exam to have evidence for significant COPD.  He is no longer smoking and is using inhalers.  I think this plays a role along with obesity and CHF in his dyspnea.  8. HTN: Since I am cutting out HCTZ, will have him increase amlodipine to 10 mg daily.   Followup in 2 months.   [name] 08/12/2014

## 2014-08-13 ENCOUNTER — Ambulatory Visit: Payer: Medicare Other | Admitting: Cardiology

## 2014-08-24 ENCOUNTER — Other Ambulatory Visit (INDEPENDENT_AMBULATORY_CARE_PROVIDER_SITE_OTHER): Payer: Medicare Other | Admitting: *Deleted

## 2014-08-24 DIAGNOSIS — E785 Hyperlipidemia, unspecified: Secondary | ICD-10-CM

## 2014-08-24 DIAGNOSIS — I2510 Atherosclerotic heart disease of native coronary artery without angina pectoris: Secondary | ICD-10-CM

## 2014-08-24 DIAGNOSIS — R0600 Dyspnea, unspecified: Secondary | ICD-10-CM

## 2014-08-24 DIAGNOSIS — I5032 Chronic diastolic (congestive) heart failure: Secondary | ICD-10-CM

## 2014-08-24 LAB — LIPID PANEL
CHOL/HDL RATIO: 2
Cholesterol: 162 mg/dL (ref 0–200)
HDL: 78.7 mg/dL (ref >39.00)
NonHDL: 83.3
Triglycerides: 290 mg/dL — ABNORMAL HIGH (ref 0.0–149.0)
VLDL: 58 mg/dL — ABNORMAL HIGH (ref 0.0–40.0)

## 2014-08-24 LAB — BASIC METABOLIC PANEL
BUN: 26 mg/dL — AB (ref 6–23)
CO2: 27 mEq/L (ref 19–32)
Calcium: 8.8 mg/dL (ref 8.4–10.5)
Chloride: 102 mEq/L (ref 96–112)
Creatinine, Ser: 1.6 mg/dL — ABNORMAL HIGH (ref 0.4–1.5)
GFR: 44.56 mL/min — AB (ref >60.00)
Glucose, Bld: 106 mg/dL — ABNORMAL HIGH (ref 70–99)
Potassium: 3.9 mEq/L (ref 3.5–5.1)
Sodium: 136 mEq/L (ref 135–145)

## 2014-08-24 LAB — BRAIN NATRIURETIC PEPTIDE: Pro B Natriuretic peptide (BNP): 75 pg/mL (ref 0.0–100.0)

## 2014-08-24 LAB — LDL CHOLESTEROL, DIRECT: LDL DIRECT: 31.8 mg/dL

## 2014-08-27 ENCOUNTER — Other Ambulatory Visit: Payer: Self-pay | Admitting: Cardiology

## 2014-09-08 ENCOUNTER — Other Ambulatory Visit: Payer: Self-pay | Admitting: Cardiology

## 2014-09-08 NOTE — Telephone Encounter (Signed)
Rx was sent to pharmacy electronically. 

## 2014-09-17 ENCOUNTER — Telehealth: Payer: Self-pay | Admitting: *Deleted

## 2014-09-17 NOTE — Telephone Encounter (Signed)
Message copied by Jacqlyn KraussLANKFORD, [name] on Mon Sep 17, 2014  2:39 PM ------      Message from: Loletha CarrowGARMAN, Miguel D      Created: Mon Sep 17, 2014  1:44 PM       Just a note to let you know that [name] has completed Accelerate Study 09/17/14. The last dose of study medication was 09/04/14. Patient will be called by phone in 30 days to make sure he is      doing okay.      Accelerate Study was stopped on 10/122015 due to evacetrapid not being superior to placebo at end of study. Please note we explained to patient and Dr. Riley KillStuckey spoke to pt also. ------

## 2014-09-21 ENCOUNTER — Encounter: Payer: Self-pay | Admitting: Cardiology

## 2014-09-27 ENCOUNTER — Telehealth: Payer: Self-pay | Admitting: *Deleted

## 2014-09-27 NOTE — Telephone Encounter (Signed)
I spoke to patient about final lab results for Accelerate Study.done on 09/17/14. Patient had elevated uric acid level of 10mg /dl. Dr.Stuckey reviewed labs and asked patient to follow- up with primary care physician. I [place] fax labs [place]. Patient stated no symptoms.

## 2014-10-08 ENCOUNTER — Encounter: Payer: Self-pay | Admitting: *Deleted

## 2014-10-08 ENCOUNTER — Other Ambulatory Visit: Payer: Self-pay | Admitting: *Deleted

## 2014-10-11 ENCOUNTER — Ambulatory Visit (INDEPENDENT_AMBULATORY_CARE_PROVIDER_SITE_OTHER): Payer: Medicare Other | Admitting: Cardiology

## 2014-10-11 ENCOUNTER — Encounter: Payer: Self-pay | Admitting: Cardiology

## 2014-10-11 VITALS — BP 128/74 | HR 57 | Ht 68.0 in | Wt 232.8 lb

## 2014-10-11 DIAGNOSIS — I5032 Chronic diastolic (congestive) heart failure: Secondary | ICD-10-CM

## 2014-10-11 DIAGNOSIS — I2510 Atherosclerotic heart disease of native coronary artery without angina pectoris: Secondary | ICD-10-CM

## 2014-10-11 DIAGNOSIS — I480 Paroxysmal atrial fibrillation: Secondary | ICD-10-CM

## 2014-10-11 DIAGNOSIS — I10 Essential (primary) hypertension: Secondary | ICD-10-CM

## 2014-10-11 DIAGNOSIS — E785 Hyperlipidemia, unspecified: Secondary | ICD-10-CM

## 2014-10-11 MED ORDER — FUROSEMIDE 40 MG PO TABS: 40.0000 mg | ORAL_TABLET | Freq: Every day | ORAL | Status: AC

## 2014-10-11 NOTE — Patient Instructions (Signed)
Increase lasix (furosemide) to 40mg  daily.   Your physician recommends that you return for lab work in: 2 weeks--BMET/BNP.  [facility] evaluation and management of COPD /shortness of breath.  Your physician recommends that you return for a FASTING lipid profile: end of January 2016.  Your physician recommends that you schedule a follow-up appointment in: 4 months with Dr Shirlee LatchMcLean.

## 2014-10-12 NOTE — Progress Notes (Signed)
Patient ID: [name], male   DOB: 10/29/1946, 68 y.o.   MRN: [id number] PCP: Dr. Nathanial Castaneda  68 yo with history of COPD, CAD s/p BMS to pLAD and DES to m LCx in 6/12 after NSTEMI and diastolic CHF presents for cardiology followup.  He had an echo done in 8/14 showing EF 45-50% with basal inferior hypokinesis.  He is still working part-time with heating and AC.  He is short of breath walking up steps or walking in [employer].  He is short of breath with heavy lifting at work.  This is chronic. He can walk around his house without problems.  No chest pain, orthopnea, PND.  Weight stable.  He wheezes occasionally and uses Symbicort for COPD.   Labs (2/14): K 3.9, creatinine 1.6, BNP 89, LDL 28, HDL 89 Labs (5/14): LDL 39, HDL 104 Labs (9/14): K 4.2, creatinine 1.7 Labs (12/14): K 4.4, creatinine 1.6, LDL 39, HDL 80 Labs (8/15): K 4.2, creatinine 1.52, BNP 809 Labs (10/15): K 4.3, creatinine 1.49, HCT 42.8, LDL 82, uric acid 10  ECG: NSR at 57  PMH: 1. CKD 2. COPD: Coreg triggered wheezing.  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  5. Migraines 6. GERD 7. HTN 8. Hyperlipidemia: Myalgias with Crestor.  9. CAD: NSTEMI in 6/12 complicated by VT arrest.  BMS to proximal LAD with staged DES to mid LCx. Initial EF 20-25%.  Repeat echo in 6/12 post-PCI showed EF 55-60%, mild LVH. Echo (8/14) with EF 45-50%, basal inferior HK, mild LVH.  10. Diastolic CHF  SH: Widower with 2 children.  [place] in [name]  Works part time Psychologist, educationalheating and AC.  Prior tobacco.  Occasional ETOH.    FH: Mother with CAD.   ROS: All systems reviewed and negative except as per HPI.   Current Outpatient Prescriptions  Medication Sig Dispense Refill  . albuterol (PROVENTIL HFA;VENTOLIN HFA) 108 (90 BASE) MCG/ACT inhaler Inhale 1-2 puffs into the lungs every 6 (six) hours as needed for wheezing or shortness of breath. 1 Inhaler 0  . amLODipine (NORVASC) 10 MG tablet Take 1 tablet (10 mg total)  by mouth daily. 30 tablet 3  . aspirin 81 MG tablet Take 81 mg by mouth daily.      Marland Kitchen. atorvastatin (LIPITOR) 20 MG tablet TAKE ONE TABLET BY MOUTH ONCE DAILY 30 tablet 2  . budesonide-formoterol (SYMBICORT) 160-4.5 MCG/ACT inhaler Inhale 2 puffs into the lungs 2 (two) times daily. 1 Inhaler 5  . Garlic 1000 MG CAPS Take 1,000 mg by mouth 2 (two) times daily.     Marland Kitchen. lisinopril (PRINIVIL,ZESTRIL) 20 MG tablet Take 1 tablet (20 mg total) by mouth daily. 90 tablet 3  . metoprolol (LOPRESSOR) 50 MG tablet TAKE ONE TABLET BY MOUTH TWICE DAILY 60 tablet 11  . Omega-3 Fatty Acids (FISH OIL) 1000 MG CAPS Take 1,000 mg by mouth 2 (two) times daily.     Marland Kitchen. omeprazole (PRILOSEC OTC) 20 MG tablet Take 20 mg by mouth every evening.     . predniSONE (DELTASONE) 50 MG tablet Take 50 mg by mouth daily with breakfast.    . topiramate (TOPAMAX) 200 MG tablet Take 200 mg by mouth as needed.    . furosemide (LASIX) 40 MG tablet Take 1 tablet (40 mg total) by mouth daily. 90 tablet 1   No current facility-administered medications for this visit.    BP 128/74 mmHg  Pulse 57  Ht 5\' 8"  (1.727 m)  Wt 232  lb 12.8 oz (105.597 kg)  BMI 35.41 kg/m2 General: NAD, obese.  Neck: JVP difficult, no thyromegaly or thyroid nodule.  Lungs: Occasional rhonchi CV: Nondisplaced PMI.  Heart regular S1/S2, no S3/S4, no murmur.  1+ edema 1/2 up lower legs bilaterally.  No carotid bruit.   Abdomen: Soft, nontender, no hepatosplenomegaly, no distention.  Neurologic: Alert and oriented x 3.  Psych: Normal affect. Extremities: No clubbing or cyanosis.   Assessment/Plan: 1. CAD: No chest pain.  He will continue ASA 81, statin, ACEI, and metoprolol.  He was on Coreg but it triggered wheezing.  2. Hyperlipidemia:  Continue current statin.  Lipids in 1/16.  3. CKD: Last creatinine 1.5 (stable compared to the past).  4. Obesity: I encouraged him to get more formal exercise.  5. Paroxysmal atrial fibrillation: He carries this diagnosis  but has had no recent occurrence and no tachypalpitations.  At this time, he is not on anticoagulation.  If he has another episode, he should be anticoagulated.  6. Chronic primarily diastolic CHF: EF 16-10%45-50% on last echo.  He has mild volume overload on exam. Increase Lasix to 40 mg daily with BMET/BNP in 2 wks.   7. COPD: This certainly contributes to his dyspnea.  I will have him followup with pulmonary.  8. HTN: BP controlled.   [name] 10/12/2014

## 2014-11-26 ENCOUNTER — Other Ambulatory Visit: Payer: Self-pay | Admitting: Cardiology

## 2014-12-07 ENCOUNTER — Other Ambulatory Visit: Payer: Self-pay | Admitting: Cardiology

## 2014-12-18 ENCOUNTER — Other Ambulatory Visit (INDEPENDENT_AMBULATORY_CARE_PROVIDER_SITE_OTHER): Payer: Medicare Other | Admitting: *Deleted

## 2014-12-18 DIAGNOSIS — E785 Hyperlipidemia, unspecified: Secondary | ICD-10-CM

## 2014-12-18 DIAGNOSIS — I2510 Atherosclerotic heart disease of native coronary artery without angina pectoris: Secondary | ICD-10-CM | POA: Diagnosis not present

## 2014-12-18 DIAGNOSIS — I480 Paroxysmal atrial fibrillation: Secondary | ICD-10-CM | POA: Diagnosis not present

## 2014-12-18 LAB — LIPID PANEL
Cholesterol: 127 mg/dL (ref 0–200)
HDL: 29.4 mg/dL — ABNORMAL LOW (ref >39.00)
LDL Cholesterol: 60 mg/dL (ref 0–99)
NONHDL: 97.6
Total CHOL/HDL Ratio: 4
Triglycerides: 190 mg/dL — ABNORMAL HIGH (ref 0.0–149.0)
VLDL: 38 mg/dL (ref 0.0–40.0)

## 2014-12-18 LAB — BASIC METABOLIC PANEL
BUN: 27 mg/dL — ABNORMAL HIGH (ref 6–23)
CO2: 29 meq/L (ref 19–32)
Calcium: 9 mg/dL (ref 8.4–10.5)
Chloride: 103 mEq/L (ref 96–112)
Creatinine, Ser: 1.8 mg/dL — ABNORMAL HIGH (ref 0.40–1.50)
GFR: 39.98 mL/min — ABNORMAL LOW (ref >60.00)
Glucose, Bld: 92 mg/dL (ref 70–99)
Potassium: 4.5 mEq/L (ref 3.5–5.1)
Sodium: 139 mEq/L (ref 135–145)

## 2014-12-18 LAB — BRAIN NATRIURETIC PEPTIDE: Pro B Natriuretic peptide (BNP): 37 pg/mL (ref 0.0–100.0)

## 2014-12-24 ENCOUNTER — Telehealth: Payer: Self-pay | Admitting: Cardiology

## 2014-12-24 DIAGNOSIS — J449 Chronic obstructive pulmonary disease, unspecified: Secondary | ICD-10-CM | POA: Diagnosis not present

## 2014-12-24 DIAGNOSIS — J0100 Acute maxillary sinusitis, unspecified: Secondary | ICD-10-CM | POA: Diagnosis not present

## 2014-12-26 NOTE — Telephone Encounter (Signed)
New Message  Pt son requested to speak w/ someone about pt's prescription not being filled. Please call back and discuss.

## 2015-01-03 DIAGNOSIS — J189 Pneumonia, unspecified organism: Secondary | ICD-10-CM | POA: Diagnosis not present

## 2015-01-07 ENCOUNTER — Ambulatory Visit: Payer: Medicare Other | Admitting: Cardiology

## 2015-01-09 ENCOUNTER — Ambulatory Visit (INDEPENDENT_AMBULATORY_CARE_PROVIDER_SITE_OTHER): Payer: Medicare Other | Admitting: Nurse Practitioner

## 2015-01-09 ENCOUNTER — Encounter: Payer: Self-pay | Admitting: Nurse Practitioner

## 2015-01-09 VITALS — BP 130/72 | HR 60 | Ht 68.0 in | Wt 235.8 lb

## 2015-01-09 DIAGNOSIS — J438 Other emphysema: Secondary | ICD-10-CM

## 2015-01-09 DIAGNOSIS — E785 Hyperlipidemia, unspecified: Secondary | ICD-10-CM | POA: Diagnosis not present

## 2015-01-09 DIAGNOSIS — I480 Paroxysmal atrial fibrillation: Secondary | ICD-10-CM

## 2015-01-09 DIAGNOSIS — I2510 Atherosclerotic heart disease of native coronary artery without angina pectoris: Secondary | ICD-10-CM | POA: Diagnosis not present

## 2015-01-09 DIAGNOSIS — R0600 Dyspnea, unspecified: Secondary | ICD-10-CM | POA: Diagnosis not present

## 2015-01-09 DIAGNOSIS — I10 Essential (primary) hypertension: Secondary | ICD-10-CM

## 2015-01-09 LAB — CBC
HCT: 43.8 % (ref 39.0–52.0)
Hemoglobin: 14.6 g/dL (ref 13.0–17.0)
MCHC: 33.3 g/dL (ref 30.0–36.0)
MCV: 88.6 fl (ref 78.0–100.0)
Platelets: 228 10*3/uL (ref 150.0–400.0)
RBC: 4.94 Mil/uL (ref 4.22–5.81)
RDW: 13.8 % (ref 11.5–15.5)
WBC: 12.5 10*3/uL — ABNORMAL HIGH (ref 4.0–10.5)

## 2015-01-09 LAB — BASIC METABOLIC PANEL
BUN: 34 mg/dL — ABNORMAL HIGH (ref 6–23)
CO2: 28 mEq/L (ref 19–32)
Calcium: 9.2 mg/dL (ref 8.4–10.5)
Chloride: 105 mEq/L (ref 96–112)
Creatinine, Ser: 1.52 mg/dL — ABNORMAL HIGH (ref 0.40–1.50)
GFR: 48.59 mL/min — ABNORMAL LOW (ref >60.00)
Glucose, Bld: 65 mg/dL — ABNORMAL LOW (ref 70–99)
Potassium: 3.9 mEq/L (ref 3.5–5.1)
Sodium: 139 mEq/L (ref 135–145)

## 2015-01-09 LAB — BRAIN NATRIURETIC PEPTIDE: Pro B Natriuretic peptide (BNP): 105 pg/mL — ABNORMAL HIGH (ref 0.0–100.0)

## 2015-01-09 NOTE — Progress Notes (Signed)
CARDIOLOGY OFFICE NOTE  Date:  01/09/2015    [name] Date of Birth: 10/29/1946 Medical Record #[id number]  PCP:  Gabriel CirriMITCHELL,RAJAN, DO  Cardiologist:  Shirlee LatchMcLean  Chief Complaint  Patient presents with  . Congestive Heart Failure    Work in visit -seen for Dr.     History of Present Illness: Miguel PasseCarl E Ahmad is a 69 y.o. male who presents today for a work in visit  - seen for Dr. Shirlee LatchMcLean. he has a history of COPD, CAD s/p BMS to pLAD and DES to m LCx in 6/12 after NSTEMI and diastolic CHF.  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 He had an echo done in 8/14 showing EF 45-50% with basal inferior hypokinesis.He has chronic dyspnea.  Last seen here in November - felt to be stable.   Comes in today. Here alone. Doing ok. This is actually an early visit back for him. This is because of worsening dyspnea. He notes that his breathing has gotten progressively worse since last visit her. Had more in the way of "congestion" last week - was treated by his PCP for "a touch of pneumonia. Had a BNP here back in January - this was normal. Has not seen Dr. Shelle Ironlance regularly. No chest pain. He has some swelling. Probably gets too much salt with eating out. Still able to work with heating & air. No PND/orthopnea. He feels like his congestion has improved since last week but his shortness of breath has not.   PMH: 1. CKD 2. COPD: Coreg triggered wheezing.  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  5. Migraines 6. GERD 7. HTN 8. Hyperlipidemia: Myalgias with Crestor.  9. CAD: NSTEMI in 6/12 complicated by VT arrest. BMS to proximal LAD with staged DES to mid LCx. Initial EF 20-25%. Repeat echo in 6/12 post-PCI showed EF 55-60%, mild LVH. Echo (8/14) with EF 45-50%, basal inferior HK, mild LVH.  10. Diastolic CHF   Past Medical History  Diagnosis Date  . COPD (chronic obstructive pulmonary disease)     ?  Marland Kitchen. Paroxysmal atrial  fibrillation   . 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 Ladona Ridgelaylor 6/10  . Syncope and collapse   . History of migraines   . GERD (gastroesophageal reflux disease)   . Dyspnea   . Hypertension   . Allergic rhinitis   . HLD (hyperlipidemia)   . CAD (coronary artery disease)     a. s/p NSTEMI 6/12: c/b VT/VF arrest req. defib and amio IV rx., pRCA occluded at cath and treated with BMS;  staged PCI of mCFX 90% on 04/28/11 with Promus DES;  residual CAD at cath 6/12: pLAD 20%, pCFX 40%, oOM1 70%, oRI 60-70%, EF 20-25%;   follow up Echo 6/12 with recovered EF:  EF 55-60%, mild LVH, mod LAE  . Heart attack 04/25/2011    Past Surgical History  Procedure Laterality Date  . Knee surgery  1971    Secondary to injury  . Cardiac electrophysiology study and ablation  05/22/09    Radiofrequency catheter ablation of AV reentrant tachycardia; Dr. Lewayne BuntingGregg Taylor  . Left heart cath  04/25/2011  . Percutaneous coronary stent intervention (pci-s)  04/25/11     Medications: Current Outpatient Prescriptions  Medication Sig Dispense Refill  . albuterol (PROVENTIL HFA;VENTOLIN HFA) 108 (90 BASE) MCG/ACT inhaler Inhale 1-2 puffs into the lungs every 6 (six) hours as needed  for wheezing or shortness of breath. 1 Inhaler 0  . amLODipine (NORVASC) 10 MG tablet TAKE ONE TABLET BY MOUTH ONCE DAILY 30 tablet 3  . aspirin 81 MG tablet Take 81 mg by mouth daily.      Marland Kitchen atorvastatin (LIPITOR) 20 MG tablet TAKE ONE TABLET BY MOUTH ONCE DAILY 30 tablet 6  . budesonide-formoterol (SYMBICORT) 160-4.5 MCG/ACT inhaler Inhale 2 puffs into the lungs 2 (two) times daily. 1 Inhaler 5  . furosemide (LASIX) 40 MG tablet Take 1 tablet (40 mg total) by mouth daily. 90 tablet 1  . Garlic 1000 MG CAPS Take 1,000 mg by mouth 2 (two) times daily.     Marland Kitchen lisinopril (PRINIVIL,ZESTRIL) 20 MG tablet Take 1 tablet (20 mg total) by mouth daily. 90 tablet 3  . metoprolol (LOPRESSOR) 50 MG tablet TAKE ONE TABLET BY MOUTH  TWICE DAILY 60 tablet 11  . Omega-3 Fatty Acids (FISH OIL) 1000 MG CAPS Take 1,000 mg by mouth 2 (two) times daily.     Marland Kitchen omeprazole (PRILOSEC OTC) 20 MG tablet Take 20 mg by mouth every evening.     . predniSONE (DELTASONE) 50 MG tablet Take 50 mg by mouth daily with breakfast.    . topiramate (TOPAMAX) 200 MG tablet Take 200 mg by mouth as needed.     No current facility-administered medications for this visit.    Allergies: Allergies  Allergen Reactions  . Carvedilol     Wheezing   . Codeine Other (See Comments)    hallucinations    Social History: The patient  reports that he quit smoking about 15 years ago. His smoking use included Cigarettes. He has a 100 pack-year smoking history. He has never used smokeless tobacco. He reports that he drinks alcohol. He reports that he does not use illicit drugs.   Family History: The patient's family history includes Breast cancer in his paternal aunt; Coronary artery disease in his mother; Diabetes in his mother; Heart disease in his father; Hypertension in his mother; Rheum arthritis in his father.   Review of Systems: Please see the history of present illness.   Otherwise, the review of systems is positive for chronic dyspnea.  He has had some cramps in his legs.  All other systems are reviewed and negative.   Physical Exam: VS:  BP 130/72 mmHg  Pulse 60  Ht  (1.727 m)  Wt 235 lb 12.8 oz (106.958 kg)  BMI 35.86 kg/m2 .  BMI Body mass index is 35.86 kg/(m^2).  Wt Readings from Last 3 Encounters:  01/09/15 235 lb 12.8 oz (106.958 kg)  10/11/14 232 lb 12.8 oz (105.597 kg)  08/10/14 233 lb (105.688 kg)    General: Pleasant. Well developed, well nourished and in no acute distress. He is obese. Weight is up 3 pounds since last visit.  HEENT: Normal. Neck: Supple, no JVD, carotid bruits, or masses noted.  Cardiac: Regular rate and rhythm. Distant due to body habitus. +1 edema of both lower legs.  Respiratory:  Lungs are clear to  auscultation bilaterally with normal work of breathing. Few faint wheezes noted.  GI: Abdomen is obese but soft and nontender.  MS: No deformity or atrophy. Gait and ROM intact. Skin: Warm and dry. Color is normal.  Neuro:  Strength and sensation are intact and no gross focal deficits noted.  Psych: Alert, appropriate and with normal affect.   LABORATORY DATA:  EKG:  EKG is ordered today. This demonstrates NSR - no acute changes .  Lab Results  Component Value Date   WBC 9.1 07/09/2014   HGB 15.2 07/09/2014   HCT 43.4 07/09/2014   PLT 230 07/09/2014   GLUCOSE 92 12/18/2014   CHOL 127 12/18/2014   TRIG 190.0* 12/18/2014   HDL 29.40* 12/18/2014   LDLDIRECT 31.8 08/24/2014   LDLCALC 60 12/18/2014   ALT 14 11/07/2013   AST 16 11/07/2013   NA 139 12/18/2014   K 4.5 12/18/2014   CL 103 12/18/2014   CREATININE 1.80* 12/18/2014   BUN 27* 12/18/2014   CO2 29 12/18/2014   TSH 1.181 04/26/2011   INR 0.92 04/25/2011   HGBA1C  04/26/2011    5.5 (NOTE)                                                                       According to the ADA Clinical Practice Recommendations for 2011, when HbA1c is used as a screening test:   >=6.5%   Diagnostic of Diabetes Mellitus           (if abnormal result  is confirmed)  5.7-6.4%   Increased risk of developing Diabetes Mellitus  References:Diagnosis and Classification of Diabetes Mellitus,Diabetes Care,2011,34(Suppl 1):S62-S69 and Standards of Medical Care in         Diabetes - 2011,Diabetes Care,2011,34  (Suppl 1):S11-S61.    BNP (last 3 results) No results for input(s): BNP in the last 8760 hours.  ProBNP (last 3 results)  Recent Labs  07/10/14 0044 08/24/14 0832 12/18/14 0736  PROBNP 809.1* 75.0 37.0     Other Studies Reviewed Today:  Echo Study Conclusions from August of 2014  - Left ventricle: The cavity size was normal. Wall thickness was increased in a pattern of mild LVH. Systolic function was mildly reduced. The  estimated ejection fraction was in the range of 45% to 50%. There is hypokinesis of the basal inferior posterior myocardium. Doppler parameters are consistent with abnormal left ventricular relaxation (grade 1 diastolic dysfunction). - Mitral valve: Calcified annulus. - Left atrium: The atrium was mildly dilated.  Assessment/Plan: 1. CAD: No active chest pain. He will continue ASA 81, statin, ACEI, and metoprolol. Coreg has triggered wheezing in the past.   2. Hyperlipidemia: Continue current statin. Recent lipids reviewed.   3. CKD: stable  4. Obesity: chronic issue  5. Paroxysmal atrial fibrillation: He carries this diagnosis but has had no recent occurrence and no tachypalpitations. At this time, he is not on anticoagulation. If he has another episode, he should be anticoagulated. His EKG today shows NSR.   6. Chronic primarily diastolic CHF but with systolic dysfunction noted on last echo: EF 45-50% on last echo from August 2014. He has mild volume overload on exam. Recheck his labs. Get his echo updated. Further disposition to follow  7. COPD: with chronic dyspnea. Will update his PFTs - may need to see pulmonary.   8. HTN: BP stable on current regimen.   Further disposition to follow.   Current medicines are reviewed with the patient today.  The patient does not have concerns regarding medicines other than what has been noted above.  The following changes have been made:  See above.  Labs/ tests ordered today include:    Orders Placed This Encounter  Procedures  .  Brain natriuretic peptide  . Basic metabolic panel  . CBC  . EKG 12-Lead  . 2D Echocardiogram without contrast  . Pulmonary function test   Disposition:   FU with Dr. Shirlee Latch as planned. We will be in touch regarding the above.   Patient is agreeable to this plan and will call if any problems develop in the interim.   Signed: Rosalio Macadamia, RN, ANP-C 01/09/2015 10:39 AM  [facility] [address] [place], [place]  [address] Phone: [phone] Fax: [phone]

## 2015-01-09 NOTE — Patient Instructions (Addendum)
We will be checking the following labs today BMET, BNP and CBC  We will get your breathing test updated - [facility]  We will get your echocardiogram updated as well.  Stay on your current medicines for now  Once we get your results - we will then decide about follow up  Call the [facility] office at [phone] if you have any questions, problems or concerns.

## 2015-01-10 ENCOUNTER — Ambulatory Visit (HOSPITAL_BASED_OUTPATIENT_CLINIC_OR_DEPARTMENT_OTHER): Payer: Medicare Other | Admitting: Radiology

## 2015-01-10 ENCOUNTER — Ambulatory Visit (HOSPITAL_COMMUNITY)
Admission: RE | Admit: 2015-01-10 | Discharge: 2015-01-10 | Disposition: A | Discharge status: Home / Self Care | Payer: Medicare Other | Source: Ambulatory Visit | Attending: Nurse Practitioner | Admitting: Nurse Practitioner

## 2015-01-10 DIAGNOSIS — I2510 Atherosclerotic heart disease of native coronary artery without angina pectoris: Secondary | ICD-10-CM

## 2015-01-10 DIAGNOSIS — I10 Essential (primary) hypertension: Secondary | ICD-10-CM | POA: Diagnosis not present

## 2015-01-10 DIAGNOSIS — E785 Hyperlipidemia, unspecified: Secondary | ICD-10-CM | POA: Diagnosis not present

## 2015-01-10 DIAGNOSIS — J438 Other emphysema: Secondary | ICD-10-CM | POA: Diagnosis not present

## 2015-01-10 DIAGNOSIS — I480 Paroxysmal atrial fibrillation: Secondary | ICD-10-CM

## 2015-01-10 DIAGNOSIS — R0600 Dyspnea, unspecified: Secondary | ICD-10-CM | POA: Diagnosis not present

## 2015-01-10 LAB — PULMONARY FUNCTION TEST
DL/VA % pred: 71 %
DL/VA: 3.21 ml/min/mmHg/L
DLCO cor % pred: 61 %
DLCO cor: 18.26 ml/min/mmHg
DLCO unc % pred: 61 %
DLCO unc: 18.26 ml/min/mmHg
FEF 25-75 Post: 1.33 L/sec
FEF 25-75 Pre: 1.04 L/sec
FEF2575-%Change-Post: 27 %
FEF2575-%Pred-Post: 56 %
FEF2575-%Pred-Pre: 44 %
FEV1-%Change-Post: 6 %
FEV1-%Pred-Post: 69 %
FEV1-%Pred-Pre: 65 %
FEV1-Post: 2.12 L
FEV1-Pre: 1.99 L
FEV1FVC-%Change-Post: -6 %
FEV1FVC-%Pred-Pre: 85 %
FEV6-%Change-Post: 9 %
FEV6-%Pred-Post: 85 %
FEV6-%Pred-Pre: 77 %
FEV6-Post: 3.32 L
FEV6-Pre: 3.03 L
FEV6FVC-%Change-Post: -3 %
FEV6FVC-%Pred-Post: 99 %
FEV6FVC-%Pred-Pre: 103 %
FVC-%Change-Post: 13 %
FVC-%Pred-Post: 85 %
FVC-%Pred-Pre: 75 %
FVC-Post: 3.55 L
FVC-Pre: 3.13 L
Post FEV1/FVC ratio: 60 %
Post FEV6/FVC ratio: 94 %
Pre FEV1/FVC ratio: 64 %
Pre FEV6/FVC Ratio: 97 %
RV % pred: 146 %
RV: 3.4 L
TLC % pred: 102 %
TLC: 6.76 L

## 2015-01-10 MED ORDER — ALBUTEROL SULFATE (2.5 MG/3ML) 0.083% IN NEBU
2.5000 mg | INHALATION_SOLUTION | Freq: Once | RESPIRATORY_TRACT | Status: AC
  Administered 2015-01-10: 2.5 mg via RESPIRATORY_TRACT

## 2015-01-10 NOTE — Progress Notes (Signed)
Echocardiogram performed.  

## 2015-01-11 ENCOUNTER — Telehealth: Payer: Self-pay | Admitting: *Deleted

## 2015-01-11 NOTE — Telephone Encounter (Signed)
-----   Message from Rosalio MacadamiaLori C Gerhardt, NP sent at 01/11/2015  8:25 AM EST ----- Ok to report. His echo is unchanged. Has just mild weakness of his pumping function but unchanged.   See PFT result note as well.

## 2015-01-14 IMAGING — CT CT ANGIO CHEST
2 of 8 series · 18 of 46 positions shown · IV contrast (OMNI)
Comparison: Prior radiograph from earlier the same day

CLINICAL DATA: Shortness of breath

EXAM:
CT ANGIOGRAPHY CHEST WITH CONTRAST
TECHNIQUE: Multidetector CT imaging of the chest was performed using the
standard protocol during bolus administration of intravenous
contrast. Multiplanar CT image reconstructions and MIPs were
obtained to evaluate the vascular anatomy.
CONTRAST:  59mL OMNIPAQUE IOHEXOL 350 MG/ML SOLN

[Series 5: thins · axial · 0.74mm/px · z∈[+1198,+1438]mm · 15 of 264 slices shown]
[im 12/264  lung]
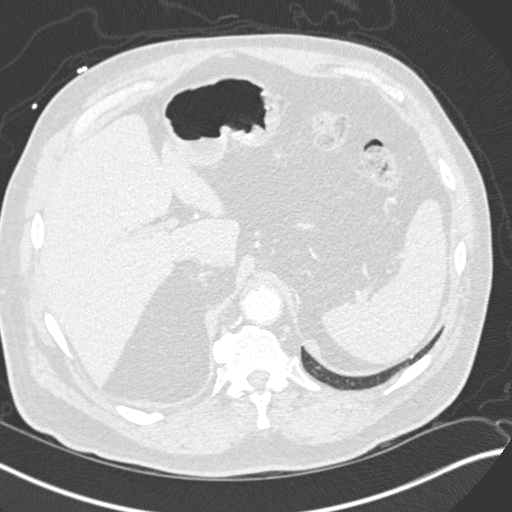
[im 36/264  soft-tissue]
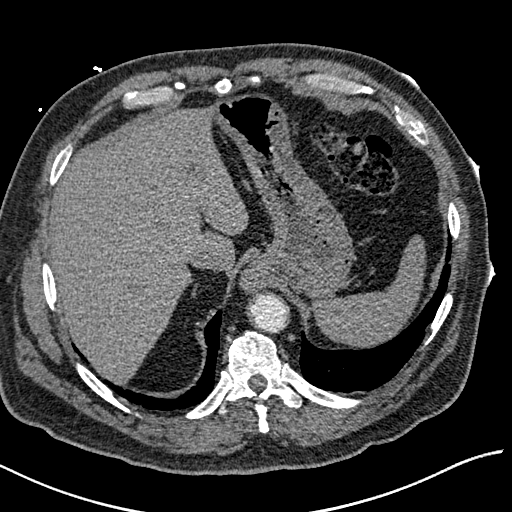
[im 48/264  lung]
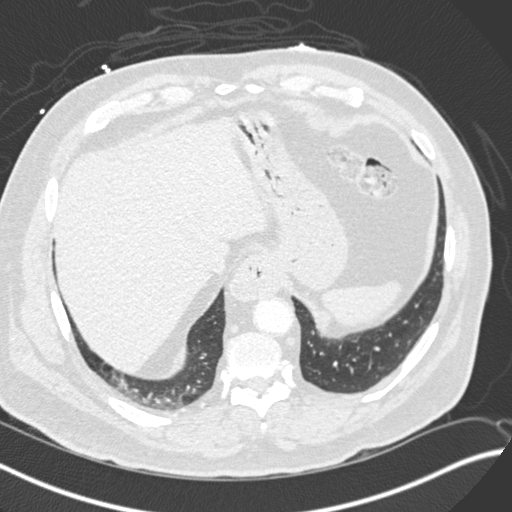
[im 60/264  soft-tissue]
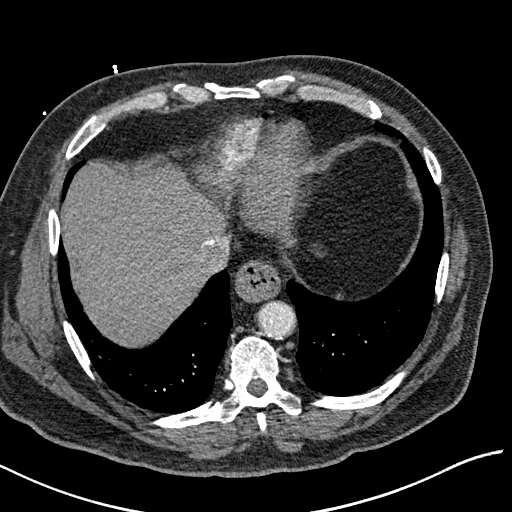
[im 84/264  lung]
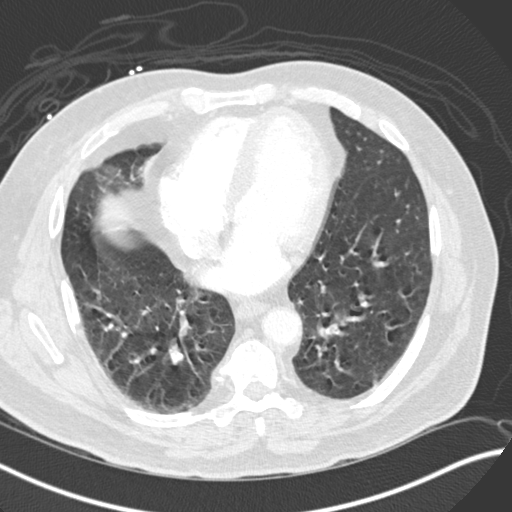
[im 96/264  soft-tissue]
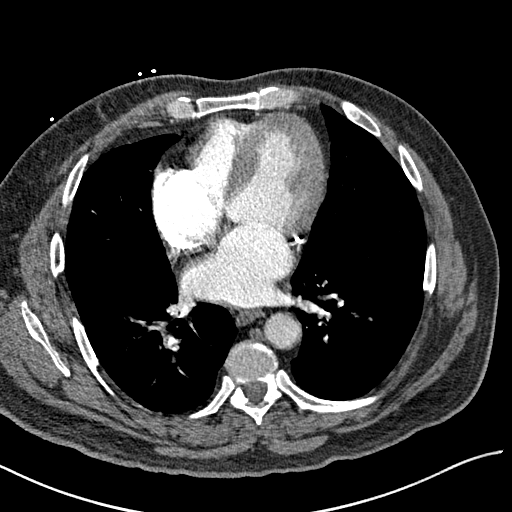
[im 120/264  lung]
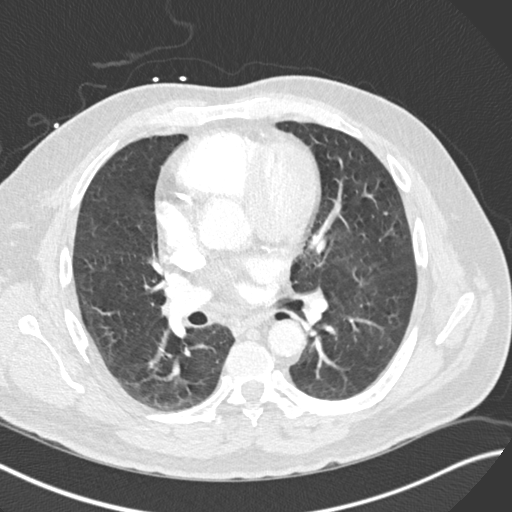
[im 132/264  soft-tissue]
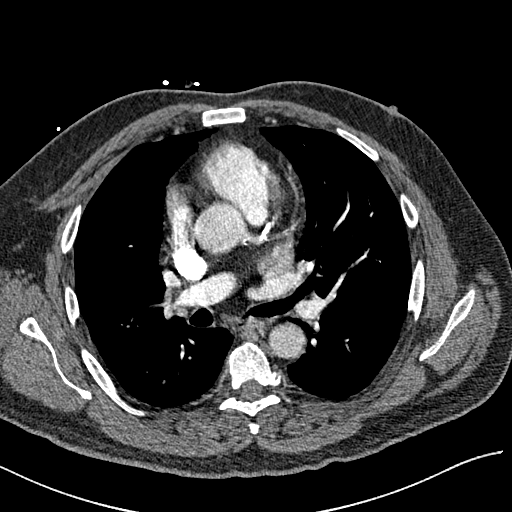
[im 144/264  lung]
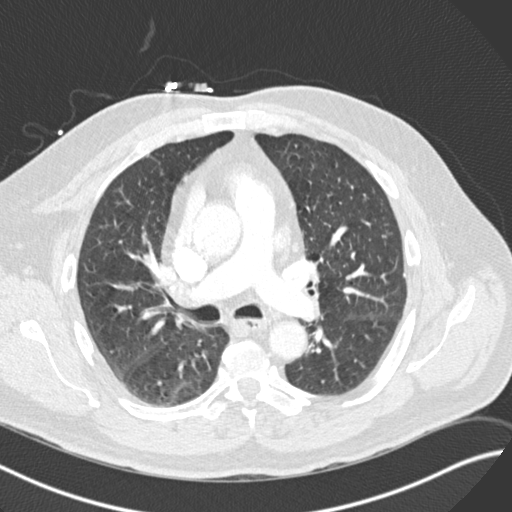
[im 168/264  soft-tissue]
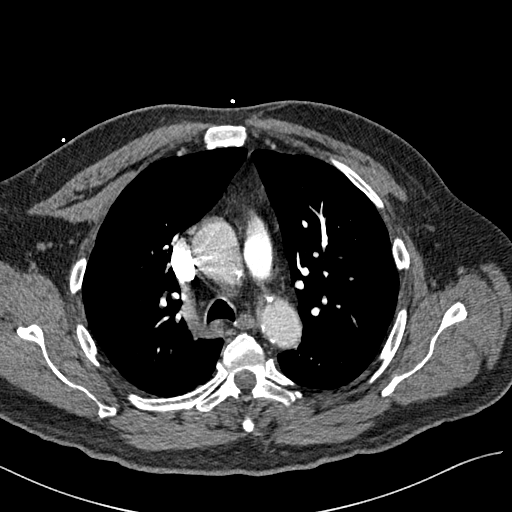
[im 180/264  lung]
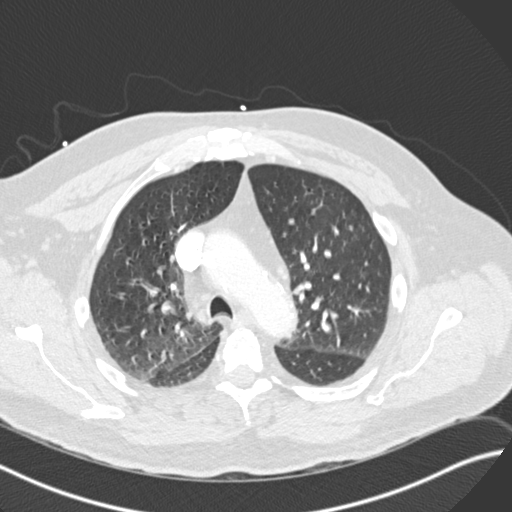
[im 204/264  soft-tissue]
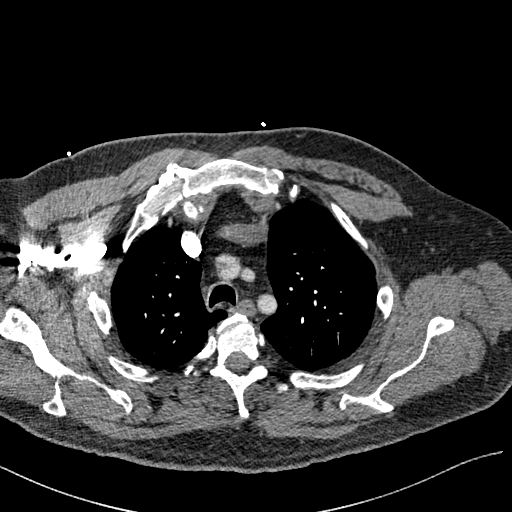
[im 216/264  lung]
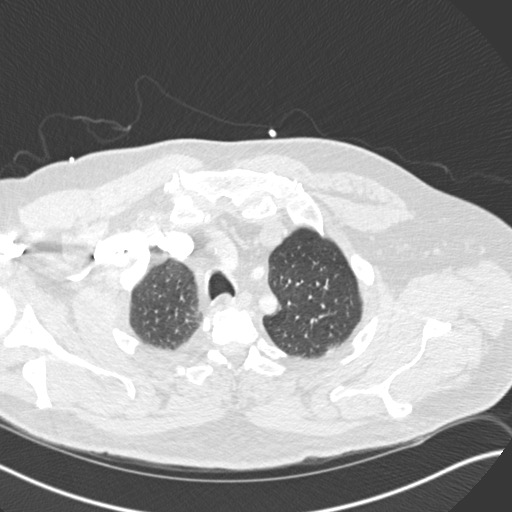
[im 228/264  soft-tissue]
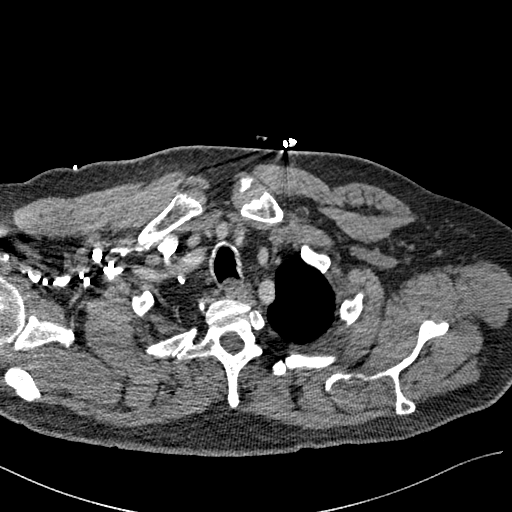
[im 252/264  lung]
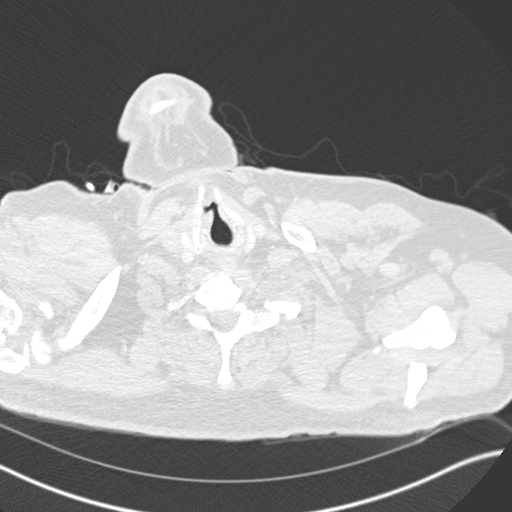

[Series 7: coronal mpr · coronal · 0.59mm/px · 3 of 132 slices shown]
[im 33/132  soft-tissue]
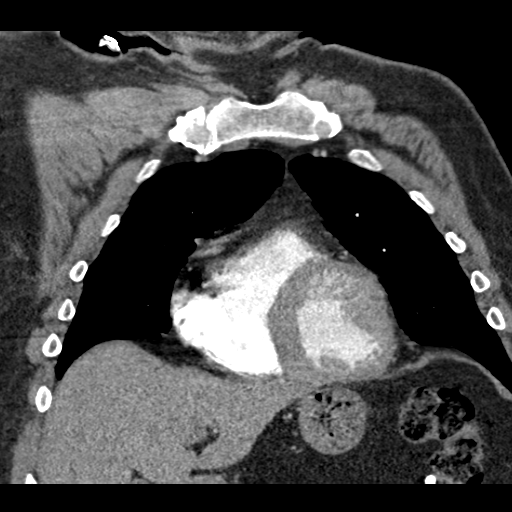
[im 66/132  soft-tissue]
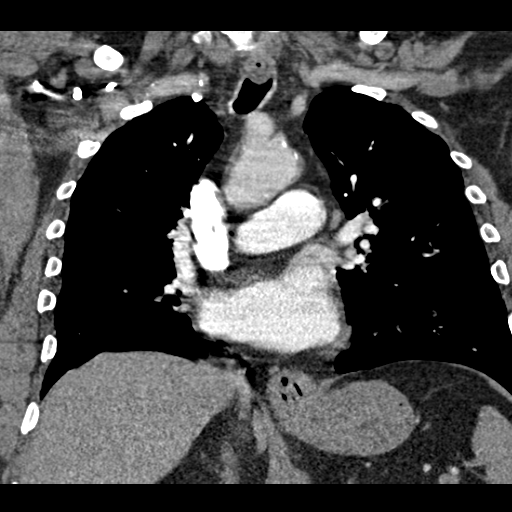
[im 99/132  soft-tissue]
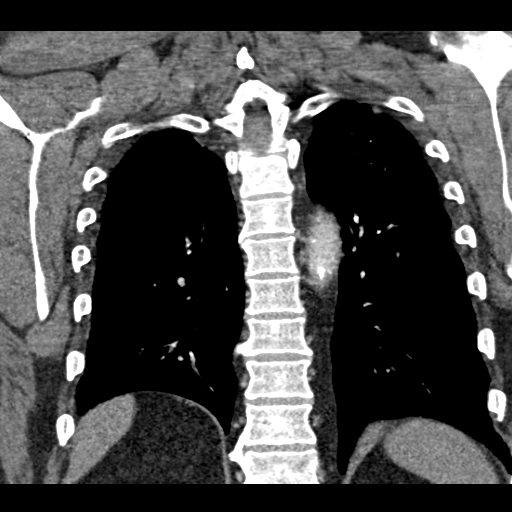

[18 of 46 positions shown; findings below may reference images not displayed]

FINDINGS: The visualized thyroid gland is within normal limits.

A mildly enlarged right hilar lymph node measures 1.1 cm in short
axis. No other pathologically enlarged mediastinal, hilar, or
axillary lymph nodes are identified.

Intrathoracic aorta is of normal caliber. Moderate calcified and
noncalcified atheromatous disease present within the aortic arch. No
high-grade stenosis seen at the origin of the great vessels.

Heart is mildly enlarged with prominent diffuse 3 vessel coronary
artery calcifications. No pericardial effusion.

Pulmonary arterial tree is well opacified. No filling defect to
suggest acute pulmonary embolism identified. Evaluation of distal
segmental arteries is somewhat limited due to respiratory motion
artifact. Re-formatted imaging confirms these findings.

The lungs are clear without focal infiltrate or pulmonary edema.
Mild subsegmental atelectasis seen dependently within the right lung
base. No pleural effusion. No pneumothorax.

Calcified granuloma noted within the posterior right upper lobe
adjacent to the right major fissure. A few subcentimeter pulmonary
nodules are seen clustered next to each other in the right upper
lobe, measuring up to 4-5 mm (series 6, image 39 37, 39). These are
indeterminate. No other pulmonary nodule or mass identified.

Small hiatal hernia noted. Visualized upper abdomen otherwise
unremarkable.
IMPRESSION: 1. No CTA evidence of acute pulmonary embolism.
2. Few subcentimeter pulmonary nodules measuring up to 4-5 mm
clustered together in the perihilar right upper lobe. These are
indeterminate, but may represent a small focal pneumonitis. A short
interval follow-up scan in 6 months could be considered to ensure
resolution/stability of these findings.
3. Mildly prominent 1.1 cm right hilar lymph node, which may be
reactive in nature.
4. Moderate atheromatous disease within the intrathoracic aorta with
diffuse 3 vessel coronary artery calcifications.
5. Small hiatal hernia.

## 2015-01-14 IMAGING — CR DG CHEST 2V
2 series · 2 of 2 positions shown · non-contrast
Comparison: 07/06/2014

CLINICAL DATA: Fever

EXAM:
CHEST  2 VIEW

[w chest pa]
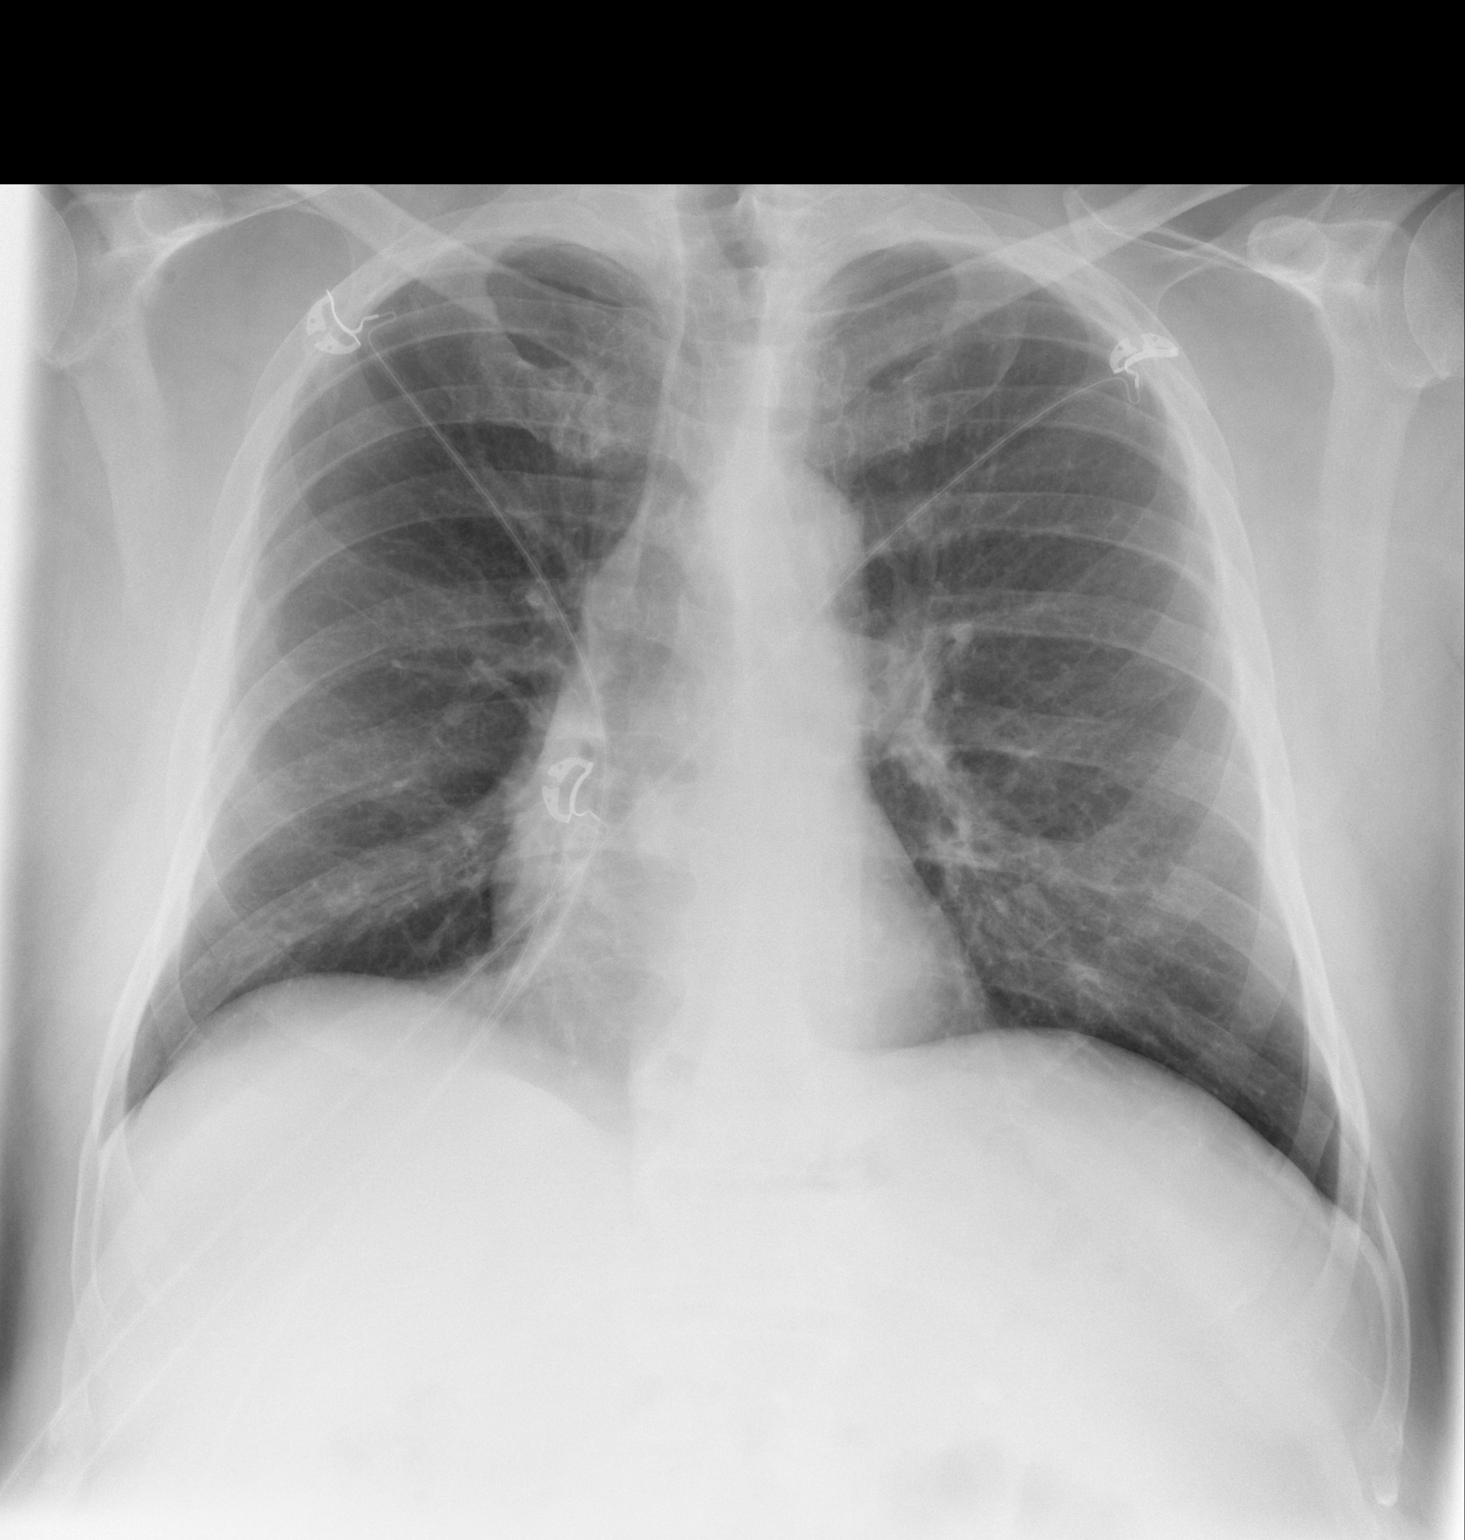

[w chest lat]
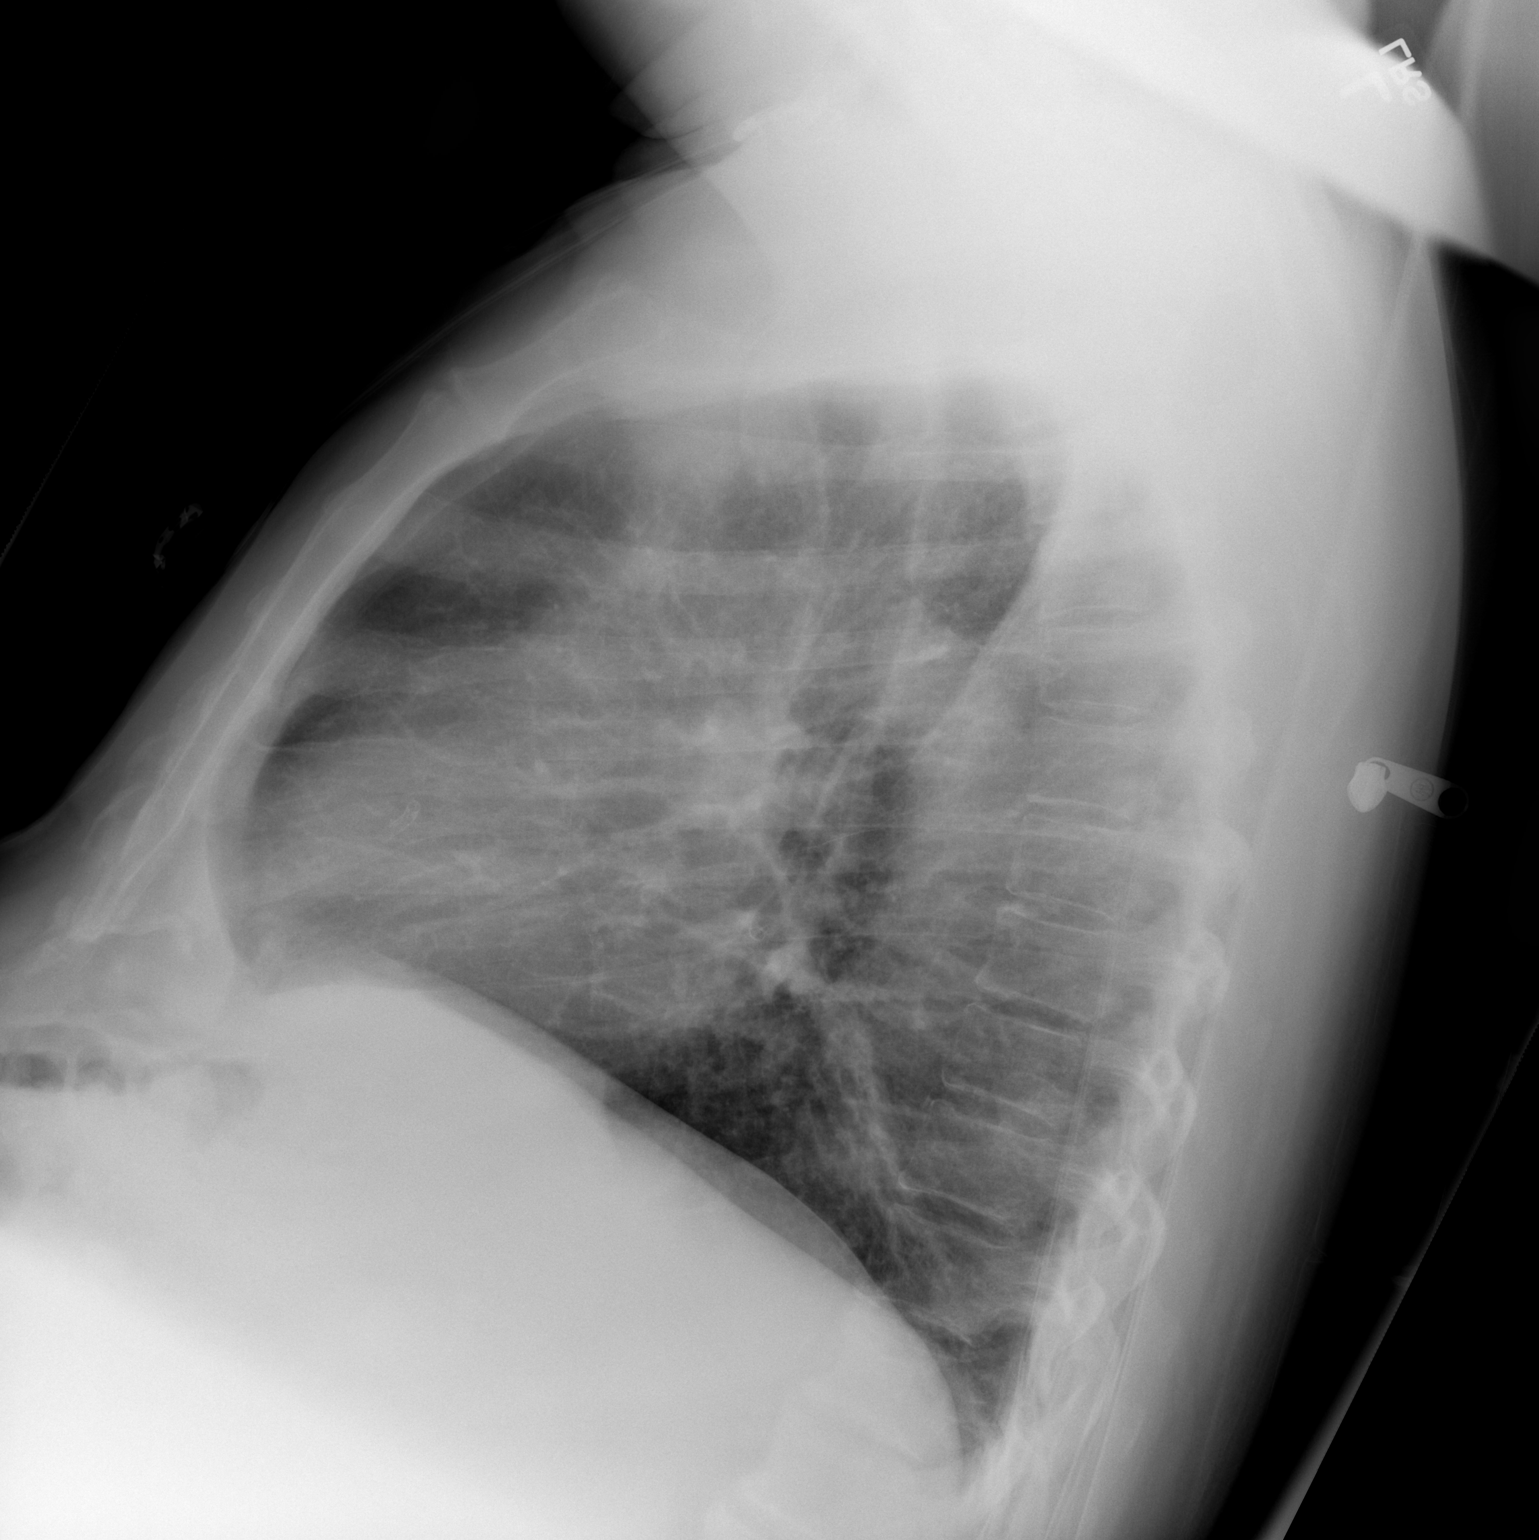

[2 of 2 positions shown; findings below may reference images not displayed]

FINDINGS: Normal heart size and upper mediastinal contours. Coronary stent
noted. There is mild chronic interstitial coarsening but no edema or
consolidation. No effusion or pneumothorax.
IMPRESSION: No active cardiopulmonary disease.

## 2015-01-28 ENCOUNTER — Ambulatory Visit: Payer: Medicare Other | Admitting: Cardiology

## 2015-02-08 DIAGNOSIS — J449 Chronic obstructive pulmonary disease, unspecified: Secondary | ICD-10-CM | POA: Diagnosis not present

## 2015-02-08 DIAGNOSIS — R509 Fever, unspecified: Secondary | ICD-10-CM | POA: Diagnosis not present

## 2015-02-22 DIAGNOSIS — R0602 Shortness of breath: Secondary | ICD-10-CM | POA: Diagnosis not present

## 2015-02-22 DIAGNOSIS — J180 Bronchopneumonia, unspecified organism: Secondary | ICD-10-CM | POA: Diagnosis not present

## 2015-02-22 DIAGNOSIS — J40 Bronchitis, not specified as acute or chronic: Secondary | ICD-10-CM | POA: Diagnosis not present

## 2015-02-22 DIAGNOSIS — J9809 Other diseases of bronchus, not elsewhere classified: Secondary | ICD-10-CM | POA: Diagnosis not present

## 2015-02-22 DIAGNOSIS — I10 Essential (primary) hypertension: Secondary | ICD-10-CM | POA: Diagnosis not present

## 2015-02-22 DIAGNOSIS — Z87891 Personal history of nicotine dependence: Secondary | ICD-10-CM | POA: Diagnosis not present

## 2015-02-22 DIAGNOSIS — E780 Pure hypercholesterolemia: Secondary | ICD-10-CM | POA: Diagnosis not present

## 2015-02-26 ENCOUNTER — Ambulatory Visit (INDEPENDENT_AMBULATORY_CARE_PROVIDER_SITE_OTHER): Payer: Medicare Other | Admitting: Pulmonary Disease

## 2015-02-26 ENCOUNTER — Encounter: Payer: Self-pay | Admitting: Pulmonary Disease

## 2015-02-26 VITALS — BP 140/88 | HR 80 | Ht 68.0 in | Wt 237.0 lb

## 2015-02-26 DIAGNOSIS — J438 Other emphysema: Secondary | ICD-10-CM | POA: Diagnosis not present

## 2015-02-26 DIAGNOSIS — R0602 Shortness of breath: Secondary | ICD-10-CM

## 2015-02-26 NOTE — Progress Notes (Signed)
   Subjective:    Patient ID: [name], male    DOB: 09/01/1946, 69 y.o.   MRN: [id number]  HPI Patient comes in today for follow-up of his known moderate COPD, and multifactorial dyspnea. He is staying on his bronchodilator regimen, but has had multiple episodes of "acute bronchitis/exacerbation". His last was approximately 3-4 days ago, and he is currently finishing up a course of prednisone and antibiotics. Based on his history, this primarily manifested itself as severe coughing, even with paroxysms. It should be noted that he is on an ACE inhibitor, and I've been concerned about this for a while with his pseudo wheezing on exam.   Review of Systems  Constitutional: Negative for fever and unexpected weight change.  HENT: Negative for congestion, dental problem, ear pain, nosebleeds, postnasal drip, rhinorrhea, sinus pressure, sneezing, sore throat and trouble swallowing.   Eyes: Negative for redness and itching.  Respiratory: Positive for cough and shortness of breath. Negative for chest tightness and wheezing.   Cardiovascular: Negative for palpitations and leg swelling.  Gastrointestinal: Negative for nausea and vomiting.  Genitourinary: Negative for dysuria.  Musculoskeletal: Negative for joint swelling.  Skin: Negative for rash.  Neurological: Negative for headaches.  Hematological: Does not bruise/bleed easily.  Psychiatric/Behavioral: Negative for dysphoric mood. The patient is not nervous/anxious.        Objective:   Physical Exam Morbidly obese male in no acute distress Nose without purulence or discharge noted Neck without lymphadenopathy or thyromegaly Chest with prominent upper airway pseudo wheezing, but no true wheezing noted. There is adequate airflow. Cardiac exam with regular rate and rhythm Lower extremities with mild edema, no cyanosis Alert and oriented, moves all 4 extremities.       Assessment & Plan:

## 2015-02-26 NOTE — Patient Instructions (Signed)
No change in your breathing medications. Will send a note to cardiology about getting you off the lisinopril to see if this helps your cough and "throat wheezing" Work on weight reduction and some type of conditioning program followup with me again in 4mos, but let us know if you continue to have issues off the lisinopril

## 2015-02-26 NOTE — Assessment & Plan Note (Signed)
The patient has multifactorial dyspnea related to his moderate COPD, underlying heart disease, and finally obesity with deconditioning. I have asked him to work aggressively on weight reduction and some type of exercise program.

## 2015-02-26 NOTE — Assessment & Plan Note (Signed)
The patient has known moderate COPD by his PFTs in 2012, and they are basically unchanged this year when taking into account the aging process over 4 years. He continues on his LABA/ICS, and I think that is adequate treatment for him considering his degree of airflow obstruction. He is getting over "acute exacerbation", but based on his symptoms I'm concerned this is more of an upper airway dysfunction related to his ACE inhibitor. He has very prominent upper airway pseudo wheezing, and this has been an ongoing issue for him. He is on an antibiotic and prednisone currently for an exacerbation, but it is unclear if this was truly an acute exacerbation.

## 2015-02-27 ENCOUNTER — Telehealth: Payer: Self-pay | Admitting: Nurse Practitioner

## 2015-02-27 DIAGNOSIS — I10 Essential (primary) hypertension: Secondary | ICD-10-CM

## 2015-02-27 MED ORDER — OLMESARTAN MEDOXOMIL 40 MG PO TABS: 40.0000 mg | ORAL_TABLET | Freq: Every day | ORAL | Status: DC

## 2015-02-27 NOTE — Telephone Encounter (Signed)
Called patient about his medication change, lab appointment and monitoring his BP. Patient is to stop lisinopril and start Diovan. Diovan was not on his formulary, so patient will be taking Benicar instead, per Norma FredricksonLori Gerhardt NP. Patient has lab scheduled with his office visit 03/29/2015. Patient verbalized understanding.

## 2015-02-27 NOTE — Telephone Encounter (Signed)
Have received message from Dr. Shelle Ironlance - he would like [name] to have a trial off of Lisinopril.  Ok to stop - will start Diovan 160 mg daily in its place.  Would like for him to monitor his BP in the interim.  BMET in one month.  Miguel MacadamiaLori C. Christia Domke, RN, ANP-C [facility] [address] [place], [place]  [id number] [phone]

## 2015-03-01 ENCOUNTER — Telehealth: Payer: Self-pay | Admitting: Cardiology

## 2015-03-01 NOTE — Telephone Encounter (Signed)
New Message  Pt son calling about BP medication that is too expensive and wanted to speak w/ Rn about alternatives. Please call back and discuss.

## 2015-03-01 NOTE — Telephone Encounter (Signed)
Patient can not afford new medication, Benicar.  Nurse placed free Benicar 40 mg samples at front desk for patient to pick up. He is to take Benicar 40 mg by mouth daily. He will pick up today. Meanwhile requesting Dr. Shirlee LatchMcLean to advise if there is a less expensive alternative that he can go on next month.

## 2015-03-02 NOTE — Telephone Encounter (Signed)
Losartan 50 mg bid.

## 2015-03-04 MED ORDER — LOSARTAN POTASSIUM 50 MG PO TABS: ORAL_TABLET | ORAL | Status: AC

## 2015-03-04 NOTE — Telephone Encounter (Signed)
Dr. Shirlee LatchMcLean discontinuing Benicar and starting Losartan 50 mg twice daily. Attempted to notify patient of change. LMTCB.

## 2015-03-05 NOTE — Telephone Encounter (Signed)
LMTCB

## 2015-03-11 NOTE — Telephone Encounter (Signed)
Pt advised per Dr Alford HighlandMcLean's recommendation he can take losartan 50mg  bid in the place of Benicar 40mg  daily.  Pt verbalized understanding.

## 2015-03-21 DIAGNOSIS — I2510 Atherosclerotic heart disease of native coronary artery without angina pectoris: Secondary | ICD-10-CM | POA: Diagnosis not present

## 2015-03-21 DIAGNOSIS — E785 Hyperlipidemia, unspecified: Secondary | ICD-10-CM | POA: Diagnosis not present

## 2015-03-21 DIAGNOSIS — I10 Essential (primary) hypertension: Secondary | ICD-10-CM | POA: Diagnosis not present

## 2015-03-26 DIAGNOSIS — E785 Hyperlipidemia, unspecified: Secondary | ICD-10-CM | POA: Diagnosis not present

## 2015-03-26 DIAGNOSIS — R0602 Shortness of breath: Secondary | ICD-10-CM | POA: Diagnosis not present

## 2015-03-26 DIAGNOSIS — I10 Essential (primary) hypertension: Secondary | ICD-10-CM | POA: Diagnosis not present

## 2015-03-29 ENCOUNTER — Other Ambulatory Visit: Payer: Medicare Other

## 2015-03-29 ENCOUNTER — Ambulatory Visit: Payer: Medicare Other | Admitting: Cardiology

## 2015-04-15 ENCOUNTER — Ambulatory Visit: Payer: Medicare Other | Admitting: Physician Assistant

## 2015-04-18 DIAGNOSIS — I10 Essential (primary) hypertension: Secondary | ICD-10-CM | POA: Diagnosis not present

## 2015-04-18 DIAGNOSIS — I2510 Atherosclerotic heart disease of native coronary artery without angina pectoris: Secondary | ICD-10-CM | POA: Diagnosis not present

## 2015-04-18 DIAGNOSIS — I4891 Unspecified atrial fibrillation: Secondary | ICD-10-CM | POA: Diagnosis not present

## 2015-04-18 DIAGNOSIS — E782 Mixed hyperlipidemia: Secondary | ICD-10-CM | POA: Diagnosis not present

## 2015-05-07 DIAGNOSIS — I2510 Atherosclerotic heart disease of native coronary artery without angina pectoris: Secondary | ICD-10-CM | POA: Diagnosis not present

## 2015-05-07 DIAGNOSIS — I10 Essential (primary) hypertension: Secondary | ICD-10-CM | POA: Diagnosis not present

## 2015-05-07 DIAGNOSIS — E782 Mixed hyperlipidemia: Secondary | ICD-10-CM | POA: Diagnosis not present

## 2015-05-07 DIAGNOSIS — I4891 Unspecified atrial fibrillation: Secondary | ICD-10-CM | POA: Diagnosis not present

## 2015-08-04 DIAGNOSIS — R0902 Hypoxemia: Secondary | ICD-10-CM | POA: Diagnosis not present

## 2015-08-04 DIAGNOSIS — K449 Diaphragmatic hernia without obstruction or gangrene: Secondary | ICD-10-CM | POA: Diagnosis not present

## 2015-08-04 DIAGNOSIS — R509 Fever, unspecified: Secondary | ICD-10-CM | POA: Diagnosis not present

## 2015-08-04 DIAGNOSIS — J449 Chronic obstructive pulmonary disease, unspecified: Secondary | ICD-10-CM | POA: Diagnosis not present

## 2015-08-04 DIAGNOSIS — J189 Pneumonia, unspecified organism: Secondary | ICD-10-CM | POA: Diagnosis not present

## 2015-08-04 DIAGNOSIS — J181 Lobar pneumonia, unspecified organism: Secondary | ICD-10-CM | POA: Diagnosis not present

## 2015-08-05 DIAGNOSIS — J441 Chronic obstructive pulmonary disease with (acute) exacerbation: Secondary | ICD-10-CM | POA: Diagnosis not present

## 2015-08-05 DIAGNOSIS — R0902 Hypoxemia: Secondary | ICD-10-CM | POA: Diagnosis not present

## 2015-08-05 DIAGNOSIS — J189 Pneumonia, unspecified organism: Secondary | ICD-10-CM | POA: Diagnosis not present

## 2015-08-05 DIAGNOSIS — D696 Thrombocytopenia, unspecified: Secondary | ICD-10-CM | POA: Diagnosis not present

## 2015-08-06 DIAGNOSIS — J441 Chronic obstructive pulmonary disease with (acute) exacerbation: Secondary | ICD-10-CM | POA: Diagnosis not present

## 2015-08-06 DIAGNOSIS — D696 Thrombocytopenia, unspecified: Secondary | ICD-10-CM | POA: Diagnosis not present

## 2015-08-06 DIAGNOSIS — J189 Pneumonia, unspecified organism: Secondary | ICD-10-CM | POA: Diagnosis not present

## 2015-08-06 DIAGNOSIS — R0902 Hypoxemia: Secondary | ICD-10-CM | POA: Diagnosis not present

## 2015-08-07 DIAGNOSIS — D696 Thrombocytopenia, unspecified: Secondary | ICD-10-CM | POA: Diagnosis not present

## 2015-08-07 DIAGNOSIS — J189 Pneumonia, unspecified organism: Secondary | ICD-10-CM | POA: Diagnosis not present

## 2015-08-07 DIAGNOSIS — R0902 Hypoxemia: Secondary | ICD-10-CM | POA: Diagnosis not present

## 2015-08-07 DIAGNOSIS — J441 Chronic obstructive pulmonary disease with (acute) exacerbation: Secondary | ICD-10-CM | POA: Diagnosis not present

## 2015-08-19 DIAGNOSIS — J449 Chronic obstructive pulmonary disease, unspecified: Secondary | ICD-10-CM | POA: Diagnosis not present

## 2015-08-19 DIAGNOSIS — E785 Hyperlipidemia, unspecified: Secondary | ICD-10-CM | POA: Diagnosis not present

## 2015-08-19 DIAGNOSIS — J189 Pneumonia, unspecified organism: Secondary | ICD-10-CM | POA: Diagnosis not present

## 2015-08-19 DIAGNOSIS — Z23 Encounter for immunization: Secondary | ICD-10-CM | POA: Diagnosis not present

## 2015-08-19 DIAGNOSIS — I10 Essential (primary) hypertension: Secondary | ICD-10-CM | POA: Diagnosis not present

## 2015-08-21 ENCOUNTER — Ambulatory Visit: Payer: Medicare Other | Admitting: Cardiology

## 2015-08-29 ENCOUNTER — Other Ambulatory Visit: Payer: Self-pay | Admitting: Cardiology

## 2015-10-10 DIAGNOSIS — I10 Essential (primary) hypertension: Secondary | ICD-10-CM | POA: Diagnosis not present

## 2015-10-10 DIAGNOSIS — I2510 Atherosclerotic heart disease of native coronary artery without angina pectoris: Secondary | ICD-10-CM | POA: Diagnosis not present

## 2015-10-10 DIAGNOSIS — E782 Mixed hyperlipidemia: Secondary | ICD-10-CM | POA: Diagnosis not present

## 2015-10-10 DIAGNOSIS — I4891 Unspecified atrial fibrillation: Secondary | ICD-10-CM | POA: Diagnosis not present

## 2015-11-19 DIAGNOSIS — E785 Hyperlipidemia, unspecified: Secondary | ICD-10-CM | POA: Diagnosis not present

## 2015-11-19 DIAGNOSIS — J449 Chronic obstructive pulmonary disease, unspecified: Secondary | ICD-10-CM | POA: Diagnosis not present

## 2015-11-19 DIAGNOSIS — Z79899 Other long term (current) drug therapy: Secondary | ICD-10-CM | POA: Diagnosis not present

## 2015-11-19 DIAGNOSIS — I10 Essential (primary) hypertension: Secondary | ICD-10-CM | POA: Diagnosis not present

## 2015-11-19 DIAGNOSIS — E669 Obesity, unspecified: Secondary | ICD-10-CM | POA: Diagnosis not present

## 2017-03-02 DIAGNOSIS — E669 Obesity, unspecified: Secondary | ICD-10-CM

## 2017-03-02 DIAGNOSIS — I10 Essential (primary) hypertension: Secondary | ICD-10-CM | POA: Diagnosis not present

## 2017-03-02 DIAGNOSIS — N183 Chronic kidney disease, stage 3 (moderate): Secondary | ICD-10-CM | POA: Diagnosis not present

## 2017-03-02 DIAGNOSIS — I5020 Unspecified systolic (congestive) heart failure: Secondary | ICD-10-CM

## 2017-03-02 DIAGNOSIS — I4891 Unspecified atrial fibrillation: Secondary | ICD-10-CM | POA: Diagnosis not present

## 2017-03-03 DIAGNOSIS — I4891 Unspecified atrial fibrillation: Secondary | ICD-10-CM | POA: Diagnosis not present

## 2017-03-03 DIAGNOSIS — I5020 Unspecified systolic (congestive) heart failure: Secondary | ICD-10-CM | POA: Diagnosis not present

## 2017-03-03 DIAGNOSIS — N183 Chronic kidney disease, stage 3 (moderate): Secondary | ICD-10-CM | POA: Diagnosis not present

## 2017-03-03 DIAGNOSIS — E669 Obesity, unspecified: Secondary | ICD-10-CM | POA: Diagnosis not present

## 2017-03-04 DIAGNOSIS — I5020 Unspecified systolic (congestive) heart failure: Secondary | ICD-10-CM | POA: Diagnosis not present

## 2017-03-04 DIAGNOSIS — N183 Chronic kidney disease, stage 3 (moderate): Secondary | ICD-10-CM | POA: Diagnosis not present

## 2017-03-04 DIAGNOSIS — E669 Obesity, unspecified: Secondary | ICD-10-CM | POA: Diagnosis not present

## 2017-03-04 DIAGNOSIS — I4891 Unspecified atrial fibrillation: Secondary | ICD-10-CM | POA: Diagnosis not present

## 2017-03-05 DIAGNOSIS — I5020 Unspecified systolic (congestive) heart failure: Secondary | ICD-10-CM | POA: Diagnosis not present

## 2017-03-05 DIAGNOSIS — N183 Chronic kidney disease, stage 3 (moderate): Secondary | ICD-10-CM | POA: Diagnosis not present

## 2017-03-05 DIAGNOSIS — I4891 Unspecified atrial fibrillation: Secondary | ICD-10-CM | POA: Diagnosis not present

## 2017-03-05 DIAGNOSIS — E669 Obesity, unspecified: Secondary | ICD-10-CM | POA: Diagnosis not present

## 2017-04-16 ENCOUNTER — Other Ambulatory Visit: Payer: Self-pay | Admitting: Pharmacist

## 2017-04-16 NOTE — Patient Outreach (Signed)
Outreach call to [employer] [facility] to complete medication review for [name]. HIPAA identifiers verified and verbal consent received.  Scheduled appointment with patient to complete this review on Wednesday, 04/21/17 at 11 am.  [name], PharmD, [facility] Clinical Pharmacist [facility] [phone]

## 2017-04-21 ENCOUNTER — Ambulatory Visit: Payer: Self-pay | Admitting: Pharmacist

## 2017-04-21 ENCOUNTER — Other Ambulatory Visit: Payer: Self-pay | Admitting: Pharmacist

## 2017-04-21 NOTE — Patient Outreach (Signed)
Outreach call to [employer] [facility] for our appointment to complete medication review for [facility] quality metric. Left a HIPAA compliant message on the patient's voicemail. If have not heard from patient by 6/1/8, will give him another call at that time.   Duanne MoronElisabeth Seiya Silsby, PharmD, [facility] Clinical Pharmacist [facility] [phone]

## 2017-04-23 ENCOUNTER — Other Ambulatory Visit: Payer: Self-pay | Admitting: Pharmacist

## 2017-04-23 ENCOUNTER — Ambulatory Visit: Payer: Self-pay | Admitting: Pharmacist

## 2017-04-23 NOTE — Patient Outreach (Signed)
Outreach call to [employer] [facility] [employer] regarding missed appointment to complete medication review for [facility] quality metric. Outreach attempt #2. Patient's son left a voicemail message on my phone yesterday. Leave a HIPAA compliant message on the patient's voicemail. If have not heard from patient by next week, will give him another call at that time.   Duanne MoronElisabeth Malon Branton, PharmD, [facility] Clinical Pharmacist [facility] [phone]

## 2017-04-30 ENCOUNTER — Other Ambulatory Visit: Payer: Self-pay | Admitting: Pharmacist

## 2017-04-30 NOTE — Patient Outreach (Signed)
[facility]) Care Management  [place]   04/30/2017  [name] 07-11-1946 [id number]  Subjective: Outreach call to [name] to complete medication review for [facility] quality metric.Spoke with patient. HIPAA identifiers verified and verbal consent received. [name] reports that his son, [name], helps with managing his medications and gives permission that I speak with [name] regarding his medications and health.  [name] reports that he and the patient are currently at the hospital. Reports that patient is in the hospital for a cardioversion procedure. Reports that the procedure has already been performed and that they are now waiting. Offer to call another time to perform this medication review, but patient and son state that now is a good time. Review each medication with the patient/[name] including name, strength, administration and indication. Review patient's allergy list.   Patient reports that he checks his own blood pressure at home and that his numbers have been good. Reports that in the hospital right now his blood pressure is 133/74 with a heart rate of 69.   [name] confirms that the patient has recently refilled his Nitrostat. Counsel on administration and storage of Nitrostat.  Reports that the patient was previously on a potassium supplement, but that this was stopped 2 months ago as directed. Reports that potassium level taken today at hospital was 3.9 mEq/L and that blood work is checked regularly.  Reports that patient has been using his Spiriva as directed nightly at bedtime. Reports that he has been using his Symbicort only as needed when he is short of breath. Reports that the patient does not currently have a Ventolin inhaler. Counsel that Symbicort is not a rescue inhaler. Counsel that Symbicort needs to be taken every day in the morning and evening as directed to keep his airways open. Counsel that patient needs to rinse mouth out after  each use of Symbicort inhaler. Counsel that the patient's "rescue" inhaler is Ventolin (albuterol) and about how this medication should be used. [name] states that he will call the patient's pharmacy to have this refilled and make sure that his father has this albuterol inhaler to use as needed.  Patient reports that his Eliquis is difficult for him to afford. Reports that when he went to have this filled, he was quoted a price of about $400. Reports that he believes that he still has a deductible. Reports that he has his prescriptions filled for 90 day supplies through [facility] mail order. States that he is not eligible for extra help based on his income level. Review with [name] the requirements for patient assistance for Eliquis through the manufacturer. [name] and the patient report that he meets this income requirement, but has not yet met the out of pocket requirement for this program. Offer to assist the patient with completing this application process once he meets the deductible. [name] states that the patient's cardiologist has been giving him samples of the medication and that he believes that the cardiologist will continue to be able to do this. Counsel on the importance of adherence to this medication, concerns about relying on samples and budgeting for the patient's deductible. [name] verbalizes understanding and states that he and the patient will call me if they are interested in applying for the Eliquis patient assistance in the future.  Objective:   Encounter Medications: Outpatient Encounter Prescriptions as of 04/30/2017  Medication Sig Note  . albuterol (PROVENTIL HFA;VENTOLIN HFA) 108 (90 BASE) MCG/ACT inhaler Inhale 1-2 puffs into the lungs every  6 (six) hours as needed for wheezing or shortness of breath.   Marland Kitchen amLODipine (NORVASC) 10 MG tablet TAKE ONE TABLET BY MOUTH ONCE DAILY   . apixaban (ELIQUIS) 5 MG TABS tablet Take 5 mg by mouth 2 (two) times daily.   Marland Kitchen atorvastatin (LIPITOR) 20 MG  tablet TAKE ONE TABLET BY MOUTH ONCE DAILY   . fluticasone (FLONASE) 50 MCG/ACT nasal spray Place 1 spray into both nostrils 2 (two) times daily.   . furosemide (LASIX) 40 MG tablet Take 1 tablet (40 mg total) by mouth daily. (Patient taking differently: Take 40 mg by mouth 2 (two) times daily. ) 04/30/2017: Reports taking 1 tablet by mouth twice daily  . losartan (COZAAR) 50 MG tablet Losartan 50 mg - Take one (1) tablet  by mouth twice daily. (Patient taking differently: Take 50 mg by mouth daily. Losartan 50 mg - Take one (1) tablet  by mouth twice daily.) 04/30/2017: Reports taking 1 tablet by mouth once daily  . metoprolol (LOPRESSOR) 50 MG tablet TAKE ONE TABLET BY MOUTH TWICE DAILY   . nitroGLYCERIN (NITROSTAT) 0.4 MG SL tablet Place 0.4 mg under the tongue every 5 (five) minutes as needed for chest pain.   Marland Kitchen omeprazole (PRILOSEC OTC) 20 MG tablet Take 20 mg by mouth every evening.    . budesonide-formoterol (SYMBICORT) 160-4.5 MCG/ACT inhaler Inhale 2 puffs into the lungs 2 (two) times daily.      Assessment:  Drugs sorted by system:  Cardiovascular: amlodipine, atorvastatin, Eliquis, furosemide, losartan, metoprolol, Nitrostat  Pulmonary/Allergy: Flonase, Symbicort, Ventolin  Gastrointestinal: omeprazole   Duplications in therapy: none noted  Gaps in therapy: patient currently on furosemide, a potassium-wasting diuretic, but not on potassium supplement. Per patient's son, patient's Cardiologist, Dr. Geraldo Castaneda, is aware that patient is on current furosemide dose and that potassium supplement was stopped 2 months ago as directed. Reports that potassium level taken today at hospital was 3.9 mEq/L and that blood work is checked regularly.  Medications to avoid in the elderly: none noted  Drug interactions: No significant interactions noted   Plan:  1) Patient to start using Symbicort every day in the morning and evening as directed. Patient to rinse out mouth after each  use. 2) Patient to have his albuterol inhaler refilled, keep this with him and use it as his "rescue" inhaler for wheezing or shortness of breath.  Harlow Asa, PharmD, [facility] [phone]

## 2017-05-05 ENCOUNTER — Other Ambulatory Visit: Payer: Self-pay | Admitting: Pharmacist

## 2017-05-05 NOTE — Patient Outreach (Signed)
Call to follow up with patient's son/caregiver Sharl MaMarty. HIPAA identifiers verified and verbal consent received.  Sharl MaMarty reports that he picked up a refill of the Ventolin inhaler for his dad from the pharmacy yesterday. Reports that his father is using this now as directed as needed for shortness of breath. Reports that his dad is using his Symbicort inhaler now on a scheduled basis in the morning and evening.  Sharl MaMarty denies any further questions of behalf of the patient at this time.   Will close pharmacy episode at this time.  Duanne MoronElisabeth Kervin Bones, PharmD, [facility] Clinical Pharmacist [facility] Care Management [phone]

## 2017-05-19 DIAGNOSIS — J9601 Acute respiratory failure with hypoxia: Secondary | ICD-10-CM | POA: Diagnosis not present

## 2017-05-19 DIAGNOSIS — I2510 Atherosclerotic heart disease of native coronary artery without angina pectoris: Secondary | ICD-10-CM | POA: Diagnosis not present

## 2017-05-19 DIAGNOSIS — I5020 Unspecified systolic (congestive) heart failure: Secondary | ICD-10-CM | POA: Diagnosis not present

## 2017-05-19 DIAGNOSIS — N183 Chronic kidney disease, stage 3 (moderate): Secondary | ICD-10-CM | POA: Diagnosis not present

## 2017-05-19 DIAGNOSIS — I480 Paroxysmal atrial fibrillation: Secondary | ICD-10-CM | POA: Diagnosis not present

## 2017-05-19 DIAGNOSIS — I509 Heart failure, unspecified: Secondary | ICD-10-CM | POA: Diagnosis not present

## 2017-05-20 DIAGNOSIS — I509 Heart failure, unspecified: Secondary | ICD-10-CM | POA: Diagnosis not present

## 2017-05-20 DIAGNOSIS — J9601 Acute respiratory failure with hypoxia: Secondary | ICD-10-CM | POA: Diagnosis not present

## 2017-05-20 DIAGNOSIS — N183 Chronic kidney disease, stage 3 (moderate): Secondary | ICD-10-CM | POA: Diagnosis not present

## 2017-05-20 DIAGNOSIS — I480 Paroxysmal atrial fibrillation: Secondary | ICD-10-CM | POA: Diagnosis not present

## 2017-05-21 DIAGNOSIS — J9601 Acute respiratory failure with hypoxia: Secondary | ICD-10-CM | POA: Diagnosis not present

## 2017-05-21 DIAGNOSIS — N183 Chronic kidney disease, stage 3 (moderate): Secondary | ICD-10-CM | POA: Diagnosis not present

## 2017-05-21 DIAGNOSIS — I480 Paroxysmal atrial fibrillation: Secondary | ICD-10-CM | POA: Diagnosis not present

## 2017-05-21 DIAGNOSIS — I509 Heart failure, unspecified: Secondary | ICD-10-CM | POA: Diagnosis not present

## 2017-09-03 ENCOUNTER — Other Ambulatory Visit: Payer: Self-pay | Admitting: Cardiology

## 2017-09-06 NOTE — Telephone Encounter (Signed)
The patient wishes to continue his care in [place], the patient was informed to request med refills from the [place] office.

## 2017-09-28 ENCOUNTER — Other Ambulatory Visit: Payer: Self-pay | Admitting: Cardiology

## 2017-12-02 DIAGNOSIS — J441 Chronic obstructive pulmonary disease with (acute) exacerbation: Secondary | ICD-10-CM | POA: Diagnosis not present

## 2017-12-02 DIAGNOSIS — Z6834 Body mass index (BMI) 34.0-34.9, adult: Secondary | ICD-10-CM | POA: Diagnosis not present

## 2017-12-14 DIAGNOSIS — J449 Chronic obstructive pulmonary disease, unspecified: Secondary | ICD-10-CM | POA: Diagnosis not present

## 2017-12-15 DIAGNOSIS — J9601 Acute respiratory failure with hypoxia: Secondary | ICD-10-CM | POA: Diagnosis not present

## 2017-12-15 DIAGNOSIS — I509 Heart failure, unspecified: Secondary | ICD-10-CM | POA: Diagnosis not present

## 2017-12-15 DIAGNOSIS — W19XXXA Unspecified fall, initial encounter: Secondary | ICD-10-CM | POA: Diagnosis not present

## 2017-12-15 DIAGNOSIS — Y92009 Unspecified place in unspecified non-institutional (private) residence as the place of occurrence of the external cause: Secondary | ICD-10-CM | POA: Diagnosis not present

## 2017-12-15 DIAGNOSIS — I480 Paroxysmal atrial fibrillation: Secondary | ICD-10-CM | POA: Diagnosis not present

## 2017-12-15 DIAGNOSIS — J9611 Chronic respiratory failure with hypoxia: Secondary | ICD-10-CM | POA: Diagnosis not present

## 2017-12-15 DIAGNOSIS — J449 Chronic obstructive pulmonary disease, unspecified: Secondary | ICD-10-CM | POA: Diagnosis not present

## 2017-12-15 DIAGNOSIS — Z9981 Dependence on supplemental oxygen: Secondary | ICD-10-CM | POA: Diagnosis not present

## 2017-12-15 DIAGNOSIS — Z6834 Body mass index (BMI) 34.0-34.9, adult: Secondary | ICD-10-CM | POA: Diagnosis not present

## 2017-12-15 DIAGNOSIS — S20219A Contusion of unspecified front wall of thorax, initial encounter: Secondary | ICD-10-CM | POA: Diagnosis not present

## 2017-12-18 DIAGNOSIS — R69 Illness, unspecified: Secondary | ICD-10-CM | POA: Diagnosis not present

## 2017-12-30 DIAGNOSIS — Z9181 History of falling: Secondary | ICD-10-CM | POA: Diagnosis not present

## 2017-12-30 DIAGNOSIS — J449 Chronic obstructive pulmonary disease, unspecified: Secondary | ICD-10-CM | POA: Diagnosis not present

## 2017-12-30 DIAGNOSIS — Z6834 Body mass index (BMI) 34.0-34.9, adult: Secondary | ICD-10-CM | POA: Diagnosis not present

## 2018-01-14 DIAGNOSIS — J449 Chronic obstructive pulmonary disease, unspecified: Secondary | ICD-10-CM | POA: Diagnosis not present

## 2018-01-15 DIAGNOSIS — I509 Heart failure, unspecified: Secondary | ICD-10-CM | POA: Diagnosis not present

## 2018-01-15 DIAGNOSIS — J449 Chronic obstructive pulmonary disease, unspecified: Secondary | ICD-10-CM | POA: Diagnosis not present

## 2018-01-15 DIAGNOSIS — J9601 Acute respiratory failure with hypoxia: Secondary | ICD-10-CM | POA: Diagnosis not present

## 2018-01-15 DIAGNOSIS — I480 Paroxysmal atrial fibrillation: Secondary | ICD-10-CM | POA: Diagnosis not present

## 2018-01-19 DIAGNOSIS — R69 Illness, unspecified: Secondary | ICD-10-CM | POA: Diagnosis not present

## 2018-02-11 DIAGNOSIS — J449 Chronic obstructive pulmonary disease, unspecified: Secondary | ICD-10-CM | POA: Diagnosis not present

## 2018-02-12 DIAGNOSIS — J9601 Acute respiratory failure with hypoxia: Secondary | ICD-10-CM | POA: Diagnosis not present

## 2018-02-12 DIAGNOSIS — I509 Heart failure, unspecified: Secondary | ICD-10-CM | POA: Diagnosis not present

## 2018-02-12 DIAGNOSIS — J449 Chronic obstructive pulmonary disease, unspecified: Secondary | ICD-10-CM | POA: Diagnosis not present

## 2018-02-12 DIAGNOSIS — I480 Paroxysmal atrial fibrillation: Secondary | ICD-10-CM | POA: Diagnosis not present

## 2018-02-15 DIAGNOSIS — E785 Hyperlipidemia, unspecified: Secondary | ICD-10-CM | POA: Diagnosis not present

## 2018-02-15 DIAGNOSIS — I10 Essential (primary) hypertension: Secondary | ICD-10-CM | POA: Diagnosis not present

## 2018-02-15 DIAGNOSIS — Z79899 Other long term (current) drug therapy: Secondary | ICD-10-CM | POA: Diagnosis not present

## 2018-02-15 DIAGNOSIS — J449 Chronic obstructive pulmonary disease, unspecified: Secondary | ICD-10-CM | POA: Diagnosis not present

## 2018-02-15 DIAGNOSIS — I482 Chronic atrial fibrillation: Secondary | ICD-10-CM | POA: Diagnosis not present

## 2018-02-15 DIAGNOSIS — Z125 Encounter for screening for malignant neoplasm of prostate: Secondary | ICD-10-CM | POA: Diagnosis not present

## 2018-02-15 DIAGNOSIS — I5030 Unspecified diastolic (congestive) heart failure: Secondary | ICD-10-CM | POA: Diagnosis not present

## 2018-02-15 DIAGNOSIS — J9611 Chronic respiratory failure with hypoxia: Secondary | ICD-10-CM | POA: Diagnosis not present

## 2018-02-15 DIAGNOSIS — Z1159 Encounter for screening for other viral diseases: Secondary | ICD-10-CM | POA: Diagnosis not present

## 2018-02-15 DIAGNOSIS — I2510 Atherosclerotic heart disease of native coronary artery without angina pectoris: Secondary | ICD-10-CM | POA: Diagnosis not present

## 2018-02-15 DIAGNOSIS — Z0000 Encounter for general adult medical examination without abnormal findings: Secondary | ICD-10-CM | POA: Diagnosis not present

## 2018-03-14 DIAGNOSIS — J449 Chronic obstructive pulmonary disease, unspecified: Secondary | ICD-10-CM | POA: Diagnosis not present

## 2018-03-15 DIAGNOSIS — J9601 Acute respiratory failure with hypoxia: Secondary | ICD-10-CM | POA: Diagnosis not present

## 2018-03-15 DIAGNOSIS — I509 Heart failure, unspecified: Secondary | ICD-10-CM | POA: Diagnosis not present

## 2018-03-15 DIAGNOSIS — I480 Paroxysmal atrial fibrillation: Secondary | ICD-10-CM | POA: Diagnosis not present

## 2018-03-15 DIAGNOSIS — J449 Chronic obstructive pulmonary disease, unspecified: Secondary | ICD-10-CM | POA: Diagnosis not present

## 2018-04-13 DIAGNOSIS — J449 Chronic obstructive pulmonary disease, unspecified: Secondary | ICD-10-CM | POA: Diagnosis not present

## 2018-04-14 DIAGNOSIS — J449 Chronic obstructive pulmonary disease, unspecified: Secondary | ICD-10-CM | POA: Diagnosis not present

## 2018-04-14 DIAGNOSIS — I509 Heart failure, unspecified: Secondary | ICD-10-CM | POA: Diagnosis not present

## 2018-04-14 DIAGNOSIS — J9601 Acute respiratory failure with hypoxia: Secondary | ICD-10-CM | POA: Diagnosis not present

## 2018-04-14 DIAGNOSIS — I480 Paroxysmal atrial fibrillation: Secondary | ICD-10-CM | POA: Diagnosis not present

## 2018-04-29 DIAGNOSIS — H5203 Hypermetropia, bilateral: Secondary | ICD-10-CM | POA: Diagnosis not present

## 2018-04-29 DIAGNOSIS — Z0100 Encounter for examination of eyes and vision without abnormal findings: Secondary | ICD-10-CM | POA: Diagnosis not present

## 2018-05-14 DIAGNOSIS — J449 Chronic obstructive pulmonary disease, unspecified: Secondary | ICD-10-CM | POA: Diagnosis not present

## 2018-05-15 DIAGNOSIS — J9601 Acute respiratory failure with hypoxia: Secondary | ICD-10-CM | POA: Diagnosis not present

## 2018-05-15 DIAGNOSIS — J449 Chronic obstructive pulmonary disease, unspecified: Secondary | ICD-10-CM | POA: Diagnosis not present

## 2018-05-15 DIAGNOSIS — I509 Heart failure, unspecified: Secondary | ICD-10-CM | POA: Diagnosis not present

## 2018-05-15 DIAGNOSIS — I480 Paroxysmal atrial fibrillation: Secondary | ICD-10-CM | POA: Diagnosis not present

## 2018-06-13 DIAGNOSIS — J449 Chronic obstructive pulmonary disease, unspecified: Secondary | ICD-10-CM | POA: Diagnosis not present

## 2018-06-14 DIAGNOSIS — I480 Paroxysmal atrial fibrillation: Secondary | ICD-10-CM | POA: Diagnosis not present

## 2018-06-14 DIAGNOSIS — J449 Chronic obstructive pulmonary disease, unspecified: Secondary | ICD-10-CM | POA: Diagnosis not present

## 2018-06-14 DIAGNOSIS — I509 Heart failure, unspecified: Secondary | ICD-10-CM | POA: Diagnosis not present

## 2018-06-14 DIAGNOSIS — J9601 Acute respiratory failure with hypoxia: Secondary | ICD-10-CM | POA: Diagnosis not present

## 2018-07-14 DIAGNOSIS — J449 Chronic obstructive pulmonary disease, unspecified: Secondary | ICD-10-CM | POA: Diagnosis not present

## 2018-07-15 DIAGNOSIS — I480 Paroxysmal atrial fibrillation: Secondary | ICD-10-CM | POA: Diagnosis not present

## 2018-07-15 DIAGNOSIS — I509 Heart failure, unspecified: Secondary | ICD-10-CM | POA: Diagnosis not present

## 2018-07-15 DIAGNOSIS — J449 Chronic obstructive pulmonary disease, unspecified: Secondary | ICD-10-CM | POA: Diagnosis not present

## 2018-07-15 DIAGNOSIS — J9601 Acute respiratory failure with hypoxia: Secondary | ICD-10-CM | POA: Diagnosis not present

## 2018-08-14 DIAGNOSIS — J449 Chronic obstructive pulmonary disease, unspecified: Secondary | ICD-10-CM | POA: Diagnosis not present

## 2018-08-15 DIAGNOSIS — J449 Chronic obstructive pulmonary disease, unspecified: Secondary | ICD-10-CM | POA: Diagnosis not present
